# Patient Record
Sex: Male | Born: 1990 | Race: White | Hispanic: No | Marital: Single | Smoking: Current every day smoker
Health system: Southern US, Community
[De-identification: ages and names within clinical notes are randomized; demographics above are authoritative.]

## PROBLEM LIST (undated history)

## (undated) DIAGNOSIS — N289 Disorder of kidney and ureter, unspecified: Secondary | ICD-10-CM

---
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
## 2010-04-21 ENCOUNTER — Encounter: Payer: Self-pay | Admitting: Family Medicine

## 2010-04-22 ENCOUNTER — Encounter: Payer: Self-pay | Admitting: Family Medicine

## 2010-06-18 LAB — URINALYSIS, ROUTINE W REFLEX MICROSCOPIC
Bilirubin Urine: NEGATIVE
Hgb urine dipstick: NEGATIVE
Ketones, ur: NEGATIVE mg/dL
Protein, ur: NEGATIVE mg/dL
pH: 7.5 (ref 5.0–8.0)

## 2010-07-16 LAB — COMPREHENSIVE METABOLIC PANEL
ALT: 31 U/L (ref 0–53)
AST: 28 U/L (ref 0–37)
Alkaline Phosphatase: 77 U/L (ref 52–171)
Calcium: 9.2 mg/dL (ref 8.4–10.5)
Chloride: 107 mEq/L (ref 96–112)
Glucose, Bld: 99 mg/dL (ref 70–99)
Total Protein: 6.7 g/dL (ref 6.0–8.3)

## 2010-07-16 LAB — URINALYSIS, ROUTINE W REFLEX MICROSCOPIC
Bilirubin Urine: NEGATIVE
Glucose, UA: NEGATIVE mg/dL
Protein, ur: NEGATIVE mg/dL
Specific Gravity, Urine: 1.025 (ref 1.005–1.030)

## 2010-07-16 LAB — DIFFERENTIAL
Eosinophils Absolute: 0 10*3/uL (ref 0.0–1.2)
Lymphocytes Relative: 18 % — ABNORMAL LOW (ref 24–48)
Neutro Abs: 10.1 10*3/uL — ABNORMAL HIGH (ref 1.7–8.0)
Neutrophils Relative %: 76 % — ABNORMAL HIGH (ref 43–71)

## 2010-07-16 LAB — CBC
HCT: 39.9 % (ref 36.0–49.0)
MCHC: 34.1 g/dL (ref 31.0–37.0)
MCV: 80.3 fL (ref 78.0–98.0)
Platelets: 272 10*3/uL (ref 150–400)
RBC: 4.96 MIL/uL (ref 3.80–5.70)
RDW: 13.8 % (ref 11.4–15.5)
WBC: 13.2 10*3/uL (ref 4.5–13.5)

## 2010-07-16 LAB — RAPID URINE DRUG SCREEN, HOSP PERFORMED
Amphetamines: NOT DETECTED
Benzodiazepines: POSITIVE — AB
Tetrahydrocannabinol: POSITIVE — AB

## 2010-07-16 LAB — ETHANOL: Alcohol, Ethyl (B): <5 mg/dL (ref 0–10)
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
## 2010-08-16 NOTE — Discharge Summary (Signed)
NAMESOHRAB, [name]                  ACCOUNT NO.:  [id number]   MEDICAL RECORD NO.:  [id number]          PATIENT TYPE:  INP   LOCATION:  0203                          FACILITY:  [facility]   PHYSICIAN:  Lalla Brothers, MDDATE OF BIRTH:  [birth year]   DATE OF ADMISSION:  10/07/2004  DATE OF DISCHARGE:  10/12/2004                                 DISCHARGE SUMMARY   IDENTIFICATION:  This 20 year old male, entering the eighth grade at [employer], was admitted emergently involuntarily on a  [facility] petition for commitment in transfer from [facility] Emergency Department for inpatient psychiatric stabilization and  treatment of suicide attempt as well as homicidal ideation. The patient had  a four-week history of agitated depression and more longstanding disruptive  behavior. Mother contacted police relative to the patient's overdose with  alcohol and Xanax informing grandparents and sister he took the pills to  kill himself and threatening to kill mother and an ex-friend talking about  him also. For full details, please see the typed admission assessment.   SYNOPSIS OF PRESENT ILLNESS:  The patient appeared to have longstanding ADHD  and oppositional defiant disorder though without any previous treatment.  Father is incarcerated and his side of the family prompts the patient's  older sister as well as the patient toward substance abuse and disruptive  behavior. The patient visited biological father in prison at the end of the  preceding school year for the first time in 2-3 years. A favorite maternal  aunt in [place] is dying with cancer likely within the next month and the  patient is highly distressed over her illness. Mother has brain damage and  indicates that she has half a brain having been hit by car at age 59.  Grandmother attempts to oversee mother's decisions though mother enables the  patient's maladaptive behaviors while grandmother  attempts to set limits.  The patient has partial blackout for his threats when intoxicated. He  started cannabis at age 85 as well as beer at that time. His urine drug  screen was positive for benzodiazepine, likely Xanax. The patient seems to  identify with mother's cognitive limitations and may have borderline  capability himself. The family is aware that he learns slowly in school.  Father was violent and there is some mental illness as well as violence and  substance abuse on the paternal side of the family with father particularly  using alcohol. The patient was banging his head prior to admission. He  reports sensitivity to CHEESE, causing itching of his throat. He has been  noncompliant with his asthma inhaler and his Nicotrol inhaler. He maintains  that a contusion to the right elbow in September of 2005 was a fracture but  x-ray was negative.   INITIAL MENTAL STATUS EXAM:  The patient was interpersonally motorically  passive while being very resistant in his decisions and thoughts. He  establishes an air of borderline intellectual functioning. He has moderate  to severe dysphoria with an avoidant distant interpersonal style. There is  no definite psychosis but differential must  include thought blocking and  disorganization. He has object loss issues as well as repressed and  suppressed anger over father's incarceration. He simply validates his  homicide threats and tends toward denial of suicide or self-destructive  attempt.   LABORATORY FINDINGS:  At [facility] Emergency Department, the  patient's basic metabolic panel was normal with random glucose 166 at 1932  hours after overdose including with alcohol. Sodium was normal at 138,  potassium 3.9, creatinine 0.9, calcium 9.7, albumin 4.5. Urinalysis was  normal with specific gravity 1.010. CBC revealed MCV slightly low at 77.4  with reference range 80-98 and MCH was slightly low at 25.9 with reference  range 27-34. Red  cell count was slightly elevated at 5.77 million with upper  limit of normal 5.6 and white count was slightly elevated at 11,300 with  upper limit of normal 10,500. Urine drug screen was negative except positive  for benzodiazepines, having overdosed with Xanax. Blood alcohol was  negative. At the [facility], hepatic function panel was normal  with AST 17, ALT 12 and GGT 15. Free T4 was normal at 1.02 and TSH at 1.663.  RPR was nonreactive. Urine probe for gonorrhea and chlamydia trachomatis  were both negative. Electrocardiogram performed for supine bradycardia  revealed a heart rate of 48 for sinus bradycardia. QRS was normal at 98, QTC  of 421 and PR at 164 milliseconds with EKG otherwise normal.   HOSPITAL COURSE AND TREATMENT:  General medical exam by Mallie Darting PA-C  noted one half-pack per day of cigarettes since age 67. The patient reported  sexual activity. Reported a right knee injury playing football in the past  and has a dog bite scar on the left cheek. He was Tanner stage IV. He had a  nearly holosystolic murmur along the left sternal border and is not certain  whether this was noted in his asthma workup. Admission height was 62 inches  with weight of 115 pounds. Blood pressure on admission was 111/71 with heart  rate of 55 (sitting) and 131/71 with heart rate of 75 (standing). He had a  similar pattern throughout his hospital stay such that on the day of  discharge, supine blood pressure was 109/65 with heart rate of 44 and  standing blood pressure 118/62 with heart rate of 79. The patient's heart  rate ranged from 41-48 (supine) and ranged from 68-87 (standing). Blood  pressures were normal as were other vital signs. The patient manifested no  asthma or chest pain during his hospital stay. He had no exertional  intolerance but tended to be sluggish and underreactive. The patient was threatening for the initial half of the hospital stay planning elopement or   making other demands for discharge. The patient would be resistant so that  he would want liquid medicine one time and pills the next seemingly in the  end as a way to undermine his treatment. He was started on Wellbutrin  titrated up to 300 mg XL daily and changed at one point to 150 mg SR b.i.d.  as the patient for awhile stated he could not swallow the XL tablet. He did  take Zyprexa Zydis up to 10 mg at a single dose p.r.n. for agitation,  insomnia, or affective decompensation. He gradually worked through his  regressive and underachieving posture and became more sincere about  treatment. He participated in group, milieu, behavioral, individual, family,  special education, occupational and therapeutic recreational, anger  management and substance abuse interventions. By the  time of discharge, he  could state that he would remain sober and can see the value of such  particularly regarding the paternal family history. He worked through much  of the grief for his aunt who is dying and concluded by the time of  discharge the strength to attend the funeral and to be supportive for other  family members as well. He was compliant with medications by the time of  discharge though even on the day of discharge acting out with a new staff  nurse that he could not take his medication. He did take his medication,  however and compliance could be established throughout the hospital stay  though mother would enable his noncompliance at times how just not knowing  except what the patient wanted. Family therapy was with grandmother and  grandfather. The patient required no seclusion or restraint during the  hospital stay.   FINAL DIAGNOSES:   AXIS I:  1.  Major depression, single episode, severe with agitation.  2.  Oppositional defiant disorder.  3.  Attention-deficit hyperactivity disorder not otherwise specified with      predominant inattention.  4.  Psychoactive substance abuse not otherwise  specified.  5.  Parent-child problem.  6.  Other specified family circumstances.  7.  Other interpersonal problem.  8.  Noncompliance with treatment.   AXIS II:  1.  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  2.  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   AXIS III:  1.  Systolic cardiac murmur and supine sinus bradycardia.  2.  History of asthma.  3.  Microcytosis.  4.  Random glucose of 166.  5.  Cigarette and cannabis smoking bronchitis symptoms.  6.  Xanax overdose.   AXIS IV:  Stressors:  Family--severe, acute and chronic; school--moderate,  chronic; phase of life--severe, acute and chronic.   AXIS V:  Global Assessment of Functioning on admission 30; highest in last  year 65; discharge Global Assessment of Functioning 53.   CONDITION ON DISCHARGE:  The patient was discharged somewhat prematurely at the family insistence as they needed to see the aunt in [place]. The  patient was established on Wellbutrin and his depressive symptoms were  improved with suicidal ideation resolved. Sobriety is emphasized. They were  educated on medication including FDA guidelines. He elected to resume the XL  tablet once in the morning by the time of discharge. He is discharged on the  following medication.   DISCHARGE MEDICATIONS:  1.  Wellbutrin 300 mg XL every morning; quantity number 30 with one refill      prescribed.  2.  Zyprexa 5 mg tablet, to use 1 pill twice daily p.r.n. depressive      agitation; samples #14 provided.  3.  Asthma inhaler as per Dr. Kerin Ransom.   FOLLOW UP:  He is to see Dr. Kerin Ransom to review the cardiac murmur and the slow  heart rate and has a copy of his EKG in his possession. The patient will see  Claretta Fraise October 17, 2004 at 0900 for intake through [facility] and psychiatric medication management appointment can be scheduled  from that. He follows a regular diet and has no  restrictions on physical  activity at this time, otherwise. Crisis and safety plans are outlined if  needed.       GEJ/MEDQ  D:  10/15/2004  T:  10/15/2004  Job:  [id number]   cc:   Claretta Fraise  [facility]  [address]  [place], [place] [address]   Dr. Illene Bolus  [facility]  [address]  [place], [place] [address]

## 2010-08-16 NOTE — Procedures (Signed)
NAMEKEISEAN, [name]                  ACCOUNT NO.:  [id number]   MEDICAL RECORD NO.:  [id number]           PATIENT TYPE:   LOCATION:                                 FACILITY:   PHYSICIAN:  Edward L. Juanetta Gosling, M.D.     DATE OF BIRTH:   DATE OF PROCEDURE:  06/18/2004  DATE OF DISCHARGE:                              PULMONARY FUNCTION TEST   Spirometry shows a very mild decrease in the FEF 25/75 which could indicate  airflow obstruction at the level of small airways.  The patient does bear a  diagnosis of asthma based on the request and this test is somewhat  suggestive of asthma.      ELH/MEDQ  D:  06/18/2004  T:  06/18/2004  Job:  [id number]   cc:   Jeoffrey Massed, MD  [address]  [place]  [place] [address]  Fax: [phone]

## 2012-11-10 DIAGNOSIS — R42 Dizziness and giddiness: Secondary | ICD-10-CM

## 2015-03-29 ENCOUNTER — Encounter (HOSPITAL_COMMUNITY): Payer: Self-pay | Admitting: *Deleted

## 2015-03-29 ENCOUNTER — Emergency Department (HOSPITAL_COMMUNITY)
Admission: EM | Admit: 2015-03-29 | Discharge: 2015-03-29 | Disposition: A | Discharge status: Home / Self Care | Payer: Self-pay | Attending: Emergency Medicine | Admitting: Emergency Medicine

## 2015-03-29 DIAGNOSIS — F17200 Nicotine dependence, unspecified, uncomplicated: Secondary | ICD-10-CM | POA: Insufficient documentation

## 2015-03-29 DIAGNOSIS — K047 Periapical abscess without sinus: Secondary | ICD-10-CM | POA: Insufficient documentation

## 2015-03-29 MED ORDER — HYDROCODONE-ACETAMINOPHEN 5-325 MG PO TABS: 2.0000 | ORAL_TABLET | ORAL | Status: DC | PRN

## 2015-03-29 MED ORDER — AMOXICILLIN-POT CLAVULANATE 875-125 MG PO TABS: 1.0000 | ORAL_TABLET | Freq: Two times a day (BID) | ORAL | Status: DC

## 2015-03-29 NOTE — Discharge Instructions (Signed)
Dental Abscess °A dental abscess is a collection of pus in or around a tooth. °CAUSES °This condition is caused by a bacterial infection around the root of the tooth that involves the inner part of the tooth (pulp). It may result from: °· Severe tooth decay. °· Trauma to the tooth that allows bacteria to enter into the pulp, such as a broken or chipped tooth. °· Severe gum disease around a tooth. °SYMPTOMS °Symptoms of this condition include: °· Severe pain in and around the infected tooth. °· Swelling and redness around the infected tooth, in the mouth, or in the face. °· Tenderness. °· Pus drainage. °· Bad breath. °· Bitter taste in the mouth. °· Difficulty swallowing. °· Difficulty opening the mouth. °· Nausea. °· Vomiting. °· Chills. °· Swollen neck glands. °· Fever. °DIAGNOSIS °This condition is diagnosed with examination of the infected tooth. During the exam, your dentist may tap on the infected tooth. Your dentist will also ask about your medical and dental history and may order X-rays. °TREATMENT °This condition is treated by eliminating the infection. This may be done with: °· Antibiotic medicine. °· A root canal. This may be performed to save the tooth. °· Pulling (extracting) the tooth. This may also involve draining the abscess. This is done if the tooth cannot be saved. °HOME CARE INSTRUCTIONS °· Take medicines only as directed by your dentist. °· If you were prescribed antibiotic medicine, finish all of it even if you start to feel better. °· Rinse your mouth (gargle) often with salt water to relieve pain or swelling. °· Do not drive or operate heavy machinery while taking pain medicine. °· Do not apply heat to the outside of your mouth. °· Keep all follow-up visits as directed by your dentist. This is important. °SEEK MEDICAL CARE IF: °· Your pain is worse and is not helped by medicine. °SEEK IMMEDIATE MEDICAL CARE IF: °· You have a fever or chills. °· Your symptoms suddenly get worse. °· You have a  very bad headache. °· You have problems breathing or swallowing. °· You have trouble opening your mouth. °· You have swelling in your neck or around your eye. °  °This information is not intended to replace advice given to you by your health care provider. Make sure you discuss any questions you have with your health care provider. °  °Document Released: 03/17/2005 Document Revised: 08/01/2014 Document Reviewed: 03/14/2014 °Elsevier Interactive Patient Education ©2016 Elsevier Inc. ° °Emergency Department Resource Guide °1) Find a Doctor and Pay Out of Pocket °Although you won't have to find out who is covered by your insurance plan, it is a good idea to ask around and get recommendations. You will then need to call the office and see if the doctor you have chosen will accept you as a new patient and what types of options they offer for patients who are self-pay. Some doctors offer discounts or will set up payment plans for their patients who do not have insurance, but you will need to ask so you aren't surprised when you get to your appointment. ° °2) Contact Your Local Health Department °Not all health departments have doctors that can see patients for sick visits, but many do, so it is worth a call to see if yours does. If you don't know where your local health department is, you can check in your phone book. The CDC also has a tool to help you locate your state's health department, and many state websites also have listings   of all of their local health departments. ° °3) Find a Walk-in Clinic °If your illness is not likely to be very severe or complicated, you may want to try a walk in clinic. These are popping up all over the country in pharmacies, drugstores, and shopping centers. They're usually staffed by nurse practitioners or physician assistants that have been trained to treat common illnesses and complaints. They're usually fairly quick and inexpensive. However, if you have serious medical issues or  chronic medical problems, these are probably not your best option. ° °No Primary Care Doctor: °- Call [facility] at  832-8000 - they can help you locate a primary care doctor that  accepts your insurance, provides certain services, etc. °- Physician Referral Service- 1-800-533-3463 ° °Chronic Pain Problems: °Organization         Address  Phone   Notes  °[facility]  (336) 297-2271 Patients need to be referred by their primary care doctor.  ° °Medication Assistance: °Organization         Address  Phone   Notes  °[place] [facility] [address] °[place], [place] [address] (336) 641-8030 --Must be a resident of [place] °-- Must have NO insurance coverage whatsoever (no Medicaid/ Medicare, etc.) °-- The pt. MUST have a primary care doctor that directs their care regularly and follows them in the community °  °[facility]  (866) 331-1348   °[employer]  (888) 892-1162   ° °Agencies that provide inexpensive medical care: °Organization         Address  Phone   Notes  °[facility]  (336) 832-8035   ° Internal Medicine    (336) 832-7272   °[facility] [address] °[place], [place] [address] (336) 832-4777   °[facility] [address], °[place] (336) 271-4999   °[facility]    (336) 373-0678   °[facility]    (336) 272-1050   °[facility] ° [address], [place] Phone:  (336) 832-4444, Fax:  (336) 832-4440 Hours of Operation:  9 am - 6 pm, M-F.  Also accepts Medicaid/Medicare and self-pay.  °[facility] ° [address], [place] Phone: (336) 832-3150, Fax: (336) 832-3151. Hours of Operation:  8:30 am - 5:30 pm, M-F.  Also accepts Medicaid and self-pay.  °[facility] [place] [address], [place] Phone: (336) 878-6027   °[facility] [address], [place], [place]  (336)723-1848, Ext. 123 Mondays & Thursdays: 7-9 AM.  First 15 patients are seen on a first come, first serve basis. °  ° °Medicaid-accepting [place] Providers: ° °Organization         Address  Phone   Notes  °[facility] [address], [place] (336) 641-2100 Also accepts self-pay patients.  °[facility] [address], [place] ° (336) 856-9996   °[facility] [address], [place] (336) 288-8857   °[facility] [address] [place] [address], [place] (336) 299-7000   °Veita Bland [address], [place]  ° (336) 373-1557 Only accepts [place] Access Medicaid patients after they have their name applied to their card.  ° °Self-Pay (no insurance) in [place]: ° °Organization         Address  Phone   Notes  °  Sickle Cell Patients, [facility] [address], [place] (336) 832-1970   °[facility] [address], [place] (336) 832-4400   °[facility] ° [address], [place] (336) 992-4800   °[facility]/Dr. Osei-Bonsu ° [address] [place] [address], [place] or [address], [place] (336) 841-8500 Phone number for both [place] and [place] locations is the same.  °[facility] [address], [place] (336) 299-0000   °[facility] [address] [place] [address], [place] or [address] (336) 852-7530 °(336) 878-2260   °[facility] [address], [place] (336) 350-1642, phone; (336) 294-5005, fax Sees patients 1st and 3rd Saturday of every month.  Must not qualify for public or private insurance (i.e. Medicaid, Medicare, [place] Health Choice, Veterans' Benefits) • Household income should be no more than 200% of the poverty level •The clinic cannot treat you if you are pregnant or think you are pregnant • Sexually transmitted  diseases are not treated at the clinic.  ° ° °Dental Care: °Organization         Address  Phone  Notes  °[facility] [facility] [address], [place] (336) 641-6152 Accepts children up to age 21 who are enrolled in Medicaid or [place] Health Choice; pregnant women with a Medicaid card; and children who have applied for Medicaid or [place] Health Choice, but were declined, whose parents can pay a reduced fee at time of service.  °[facility] [place]  [address], [place] (336) 641-7733 Accepts children up to age 21 who are enrolled in Medicaid or [place] Health Choice; pregnant women with a Medicaid card; and children who have applied for Medicaid or [place] Health Choice, but were declined, whose parents can pay a reduced fee at time of service.  °[facility] ° [address], [place] (336) 641-4533 Patients are seen by appointment only. Walk-ins are not accepted. [facility] will see patients 18 years of age and older. °Monday - Tuesday (8am-5pm) °Most Wednesdays (8:30-5pm) °$30 per visit, cash only  °[facility] ° [address], [place] (336) 641-4533 Patients are seen by appointment only. Walk-ins are not accepted. [facility] will see patients 18 years of age and older. °One Wednesday Evening (Monthly: Volunteer Based).  $30 per visit, cash only  °[facility]  (919) 537-3737 for adults; Children under age 4, call Graduate Pediatric Dentistry at (919) [phone]. Children aged 4-14, please call (919) [phone] to request a pediatric application. ° Dental services are provided in all areas of dental care including fillings, crowns and bridges, complete and partial dentures, implants, gum treatment, root canals, and extractions. Preventive care is also provided. Treatment is provided to both adults and children. °Patients are selected via a  lottery and there is often a waiting list. °  °[facility] [address], ° ° (336) 763-8833 [email or web address] °  °[facility] [address], [place], [place] (336)723-1848, Ext. 123 Second and Fourth Thursday of each month, opens at 6:30 AM; Clinic ends at 9 AM.  Patients are seen on a first-come first-served basis, and a limited number are seen during each clinic.  ° °[facility] ° [address], [place], [place] (336) 723-7904   Eligibility   Requirements °You must have lived in [place], [place], or [place] counties for at least the last three months. °  You cannot be eligible for state or federal sponsored healthcare insurance, including Veterans Administration, Medicaid, or Medicare. °  You generally cannot be eligible for healthcare insurance through your employer.  °  How to apply: °Eligibility screenings are held every Tuesday and Wednesday afternoon from 1:00 pm until 4:00 pm. You do not need an appointment for the interview!  °[facility] [address], [place], [place] 336-631-2330   °[facility]  336-342-8273   °[facility]  336-703-3100   °[facility]  336-570-6415   ° °Behavioral Health Resources in the Community: °Intensive Outpatient Programs °Organization         Address  Phone  Notes  °[facility] [address], [place], [place] 336-878-6098   °[facility] [address], [place], [place] 336-832-9800   °[facility] [address], [place], [place] ° 336-882-2125   °[facility] [address],  °[place], [place] 1-800-853-5163 or 336-641-4981   °Substance Abuse Resources °Organization         Address  Phone  Notes  °[facility]  336-882-2125   °[facility]  336-784-9470   °[facility]  336-285-9073   °[facility]  336-845-3988   °Residential &  Outpatient Substance Abuse Program  1-800-659-3381   °Psychological Services °Organization         Address  Phone  Notes  °[facility]  336- 832-9600   °[facility]  336- 378-7881   °[facility] [address], [place] 1-800-853-5163 or 336-641-4981   ° °Mobile Crisis Teams °Organization         Address  Phone  Notes  °[facility], Mobile Crisis Care Unit  1-877-626-1772   °Assertive °[facility] ° [address] [place], [place] 336-834-9664   °Sharon DeEsch [address] °[place] 336-554-5454   ° °Self-Help/Support Groups °Organization         Address  Phone             Notes  °[facility] [place] - variety of support groups  336- 373-1402 Call for more information  °Narcotics Anonymous (NA), [facility] [address], °[place]  2 meetings at this location  ° °Residential Treatment Programs °Organization         Address  Phone  Notes  °[facility] [address],    °[place]  1-866-801-8205   °[facility] ° [address], [place], [place] 704-293-8524   °[facility] [address], [place] 336-845-3988 Admissions: 8am-3pm M-F  °[facility] [address]    °[place], [place] 336-841-1104   °[facility] [address], [place], [place] 336-379-7146   °[facility] [address]  °[place], [place] 336-285-9073   °[facility] - Intensive Outpatient [address], [place], [place] 336-852-3033   °[facility] [address]  °[place], [place] 1-877-615-2722 or 336-784-9470   °[facility] [address] [place], [place] 336-227-7417 Accepts Medicaid  °[facility] [address]  °[place] 1-800-659-3381 Substance Abuse/Addiction Treatment  ° °[place] Behavioral Health Resources °Organization            Address  Phone  Notes  °[facility]  (888) 581-9988   °Julie Brannon, PhD [address] [place], [place]   (336) 349-5553 or (336) 951-0000   °[facility]   [address] °[place], [place] (336) 349-4454   °[facility] [address], [place], [place] (336) 342-8316 Insurance/Medicaid/sponsorship through [facility]  °[facility] [address]                                    [place], [place] (336) 342-8316 Therapy/tele-psych/case  °[facility] [address]  ° [place], [place] (336) 349-2233    °Dr. Arfeen  (336) 349-4544   °[facility] [place]  [employer] [place] [facility] 1) [address], [place] °2) [address], [place] °3)  [address], [place] (336) 349-3220 °(336) 342-7768 ° °(336) 342-8140   °[place] [facility] (336) 342-1394 or (336) 342-3537 (After Hours)    ° ° °

## 2015-03-29 NOTE — ED Provider Notes (Signed)
CSN: [id number]     Arrival date & time 03/29/15  0543 History   First MD Initiated Contact with Patient 03/29/15 [phone]     Chief Complaint  Patient presents with  . Dental Pain     (Consider location/radiation/quality/duration/timing/severity/associated sxs/prior Treatment) HPI Comments: Patient presents to the ER for evaluation of dental pain. Patient reports that he has been having problems on and off for a month, but in the last 24 hours has noticed severe pain and swelling in the right side of his lower jaw.  Patient is a 24 y.o. male presenting with tooth pain.  Dental Pain   History reviewed. No pertinent past medical history. History reviewed. No pertinent past surgical history. No family history on file. Social History  Substance Use Topics  . Smoking status: Current Every Day Smoker  . Smokeless tobacco: None  . Alcohol Use: No    Review of Systems  HENT: Positive for dental problem.   All other systems reviewed and are negative.     Allergies  Review of patient's allergies indicates no known allergies.  Home Medications   Prior to Admission medications   Not on File   BP 129/91 mmHg  Pulse 78  Temp(Src) 97.8 F (36.6 C) (Oral)  Resp 16  SpO2 97% Physical Exam  Constitutional: He is oriented to person, place, and time. He appears well-developed and well-nourished. No distress.  HENT:  Head: Normocephalic and atraumatic.  Right Ear: Hearing normal.  Left Ear: Hearing normal.  Nose: Nose normal.  Mouth/Throat: Oropharynx is clear and moist and mucous membranes are normal.    Eyes: Conjunctivae and EOM are normal. Pupils are equal, round, and reactive to light.  Neck: Normal range of motion. Neck supple.  Cardiovascular: Regular rhythm, S1 normal and S2 normal.  Exam reveals no gallop and no friction rub.   No murmur heard. Pulmonary/Chest: Effort normal and breath sounds normal. No respiratory distress. He exhibits no tenderness.  Abdominal: Soft.  Normal appearance and bowel sounds are normal. There is no hepatosplenomegaly. There is no tenderness. There is no rebound, no guarding, no tenderness at McBurney's point and negative Murphy's sign. No hernia.  Musculoskeletal: Normal range of motion.  Neurological: He is alert and oriented to person, place, and time. He has normal strength. No cranial nerve deficit or sensory deficit. Coordination normal. GCS eye subscore is 4. GCS verbal subscore is 5. GCS motor subscore is 6.  Skin: Skin is warm, dry and intact. No rash noted. No cyanosis.  Psychiatric: He has a normal mood and affect. His speech is normal and behavior is normal. Thought content normal.  Nursing note and vitals reviewed.   ED Course  Procedures (including critical care time) Labs Review Labs Reviewed - No data to display  Imaging Review No results found. I have personally reviewed and evaluated these images and lab results as part of my medical decision-making.   EKG Interpretation None      MDM   Final diagnoses:  None  Dental Abscess  Presents with spontaneously draining dental abscess of the right lower jaw. Will initiate antibiotic coverage, analgesia. Patient to follow-up with dentist.    Gilda Creasehristopher J Malijah Lietz, MD 03/29/15 [phone]

## 2015-03-29 NOTE — ED Notes (Signed)
Pt c/o dental pain that started last night,

## 2015-03-29 NOTE — ED Notes (Signed)
Pt states understanding of care given and follow up instructions 

## 2015-05-15 ENCOUNTER — Emergency Department (HOSPITAL_COMMUNITY)
Admission: EM | Admit: 2015-05-15 | Discharge: 2015-05-15 | Disposition: A | Discharge status: Home / Self Care | Payer: Self-pay | Attending: Emergency Medicine | Admitting: Emergency Medicine

## 2015-05-15 ENCOUNTER — Encounter (HOSPITAL_COMMUNITY): Payer: Self-pay | Admitting: *Deleted

## 2015-05-15 ENCOUNTER — Emergency Department (HOSPITAL_COMMUNITY): Payer: Self-pay

## 2015-05-15 DIAGNOSIS — Y9389 Activity, other specified: Secondary | ICD-10-CM | POA: Insufficient documentation

## 2015-05-15 DIAGNOSIS — W208XXA Other cause of strike by thrown, projected or falling object, initial encounter: Secondary | ICD-10-CM | POA: Insufficient documentation

## 2015-05-15 DIAGNOSIS — F17200 Nicotine dependence, unspecified, uncomplicated: Secondary | ICD-10-CM | POA: Insufficient documentation

## 2015-05-15 DIAGNOSIS — Z792 Long term (current) use of antibiotics: Secondary | ICD-10-CM | POA: Insufficient documentation

## 2015-05-15 DIAGNOSIS — Y9289 Other specified places as the place of occurrence of the external cause: Secondary | ICD-10-CM | POA: Insufficient documentation

## 2015-05-15 DIAGNOSIS — S20212A Contusion of left front wall of thorax, initial encounter: Secondary | ICD-10-CM | POA: Insufficient documentation

## 2015-05-15 DIAGNOSIS — S299XXA Unspecified injury of thorax, initial encounter: Secondary | ICD-10-CM

## 2015-05-15 DIAGNOSIS — Y998 Other external cause status: Secondary | ICD-10-CM | POA: Insufficient documentation

## 2015-05-15 DIAGNOSIS — R062 Wheezing: Secondary | ICD-10-CM | POA: Insufficient documentation

## 2015-05-15 MED ORDER — ALBUTEROL (5 MG/ML) CONTINUOUS INHALATION SOLN
10.0000 mg/h | INHALATION_SOLUTION | Freq: Once | RESPIRATORY_TRACT | Status: AC
  Administered 2015-05-15: 10 mg/h via RESPIRATORY_TRACT

## 2015-05-15 MED ORDER — IBUPROFEN 800 MG PO TABS: 800.0000 mg | ORAL_TABLET | Freq: Three times a day (TID) | ORAL | Status: DC | PRN

## 2015-05-15 MED ORDER — ALBUTEROL SULFATE HFA 108 (90 BASE) MCG/ACT IN AERS
2.0000 | INHALATION_SPRAY | Freq: Once | RESPIRATORY_TRACT | Status: AC
  Administered 2015-05-15: 2 via RESPIRATORY_TRACT
  Filled 2015-05-15: qty 6.7

## 2015-05-15 NOTE — ED Notes (Signed)
Pt c/o chest pain that started x 4 days ago; pt states he was wrestling with someone and the person landed on his left side of chest and he has been having pain since then

## 2015-05-15 NOTE — ED Provider Notes (Signed)
TIME SEEN: 2:00 AM  CHIEF COMPLAINT: chest wall pain  HPI: Pt is a 25 y.o. 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 States it hurts to move and breathe. No head injury or loss of consciousness. No numbness, tingling or focal weakness. No other injury.   Patient is wheezing on exam. He denies shortness of breath. No cough. No fever. No history of asthma. He is a smoker.  ROS: See HPI Constitutional: no fever  Eyes: no drainage  ENT: no runny nose   Cardiovascular:   chest pain  Resp: no SOB  GI: no vomiting GU: no dysuria Integumentary: no rash  Allergy: no hives  Musculoskeletal: no leg swelling  Neurological: no slurred speech ROS otherwise negative  PAST MEDICAL HISTORY/PAST SURGICAL HISTORY:  History reviewed. No pertinent past medical history.  MEDICATIONS:  Prior to Admission medications   Medication Sig Start Date End Date Taking? Authorizing Provider  amoxicillin-clavulanate (AUGMENTIN) 875-125 MG tablet Take 1 tablet by mouth 2 (two) times daily. 03/29/15   Gilda Crease, MD  HYDROcodone-acetaminophen (NORCO/VICODIN) 5-325 MG tablet Take 2 tablets by mouth every 4 (four) hours as needed for moderate pain. 03/29/15   Gilda Crease, MD    ALLERGIES:  No Known Allergies  SOCIAL HISTORY:  Social History  Substance Use Topics  . Smoking status: Current Every Day Smoker -- 0.50 packs/day  . Smokeless tobacco: Not on file  . Alcohol Use: Yes     Comment: occasionally    FAMILY HISTORY: History reviewed. No pertinent family history.  EXAM: BP 129/70 mmHg  Pulse 90  Temp(Src) 97.4 F (36.3 C) (Oral)  Resp 20  Ht  (1.676 m)  Wt 149 lb (67.586 kg)  BMI 24.06 kg/m2  SpO2 100% CONSTITUTIONAL: Alert and oriented and responds appropriately to questions. Well-appearing; well-nourished; GCS 15 HEAD: Normocephalic;  atraumatic EYES: Conjunctivae clear, PERRL, EOMI ENT: normal nose; no rhinorrhea; moist mucous membranes; pharynx without lesions noted; no dental injury; no septal hematoma NECK: Supple, no meningismus, no LAD; no midline spinal tenderness, step-off or deformity CARD: RRR; S1 and S2 appreciated; no murmurs, no clicks, no rubs, no gallops RESP: Normal chest excursion without splinting or tachypnea; breath sounds equal bilaterally, diffuse expiratory wheezing worse at his bases, no rhonchi, no rales; no hypoxia or respiratory distress CHEST:  chest wall stable, no crepitus or ecchymosis or deformity, tender over the left ribs, no flail chest ABD/GI: Normal bowel sounds; non-distended; soft, non-tender, no rebound, no guarding PELVIS:  stable, nontender to palpation BACK:  The back appears normal and is non-tender to palpation, there is no CVA tenderness; no midline spinal tenderness, step-off or deformity EXT: Normal ROM in all joints; non-tender to palpation; no edema; normal capillary refill; no cyanosis, no bony tenderness or bony deformity of patient's extremities, no joint effusion, no ecchymosis or lacerations    SKIN: Normal color for age and race; warm NEURO: Moves all extremities equally, sensation to light touch intact diffusely, cranial nerves II through XII intact PSYCH: The patient's mood and manner are appropriate. Grooming and personal hygiene are appropriate.  MEDICAL DECISION MAKING: Patient here with chest contusion that occurred 4 days ago. X-ray shows no rib fractures, pneumothorax, atelectasis. He is not hypoxic. He does have some wheezing suspect this is from smoking. Advised to quit smoking. Have given him albuterol treatment his lungs are clear to  auscultation. No respiratory distress or increased work of breathing. I feel he is safe to be discharged home. Advised to use ibuprofen and Tylenol for pain control. Will also discharge with albuterol inhaler for his wheezing. I do not  think he needs to be on steroids at this time. He denies history of asthma, COPD, recent fevers, cough or shortness of breath. Discussed return precautions. He verbalizes understanding and is comfortable with this plan. Given outpatient follow-up.     EKG Interpretation  Date/Time:  Tuesday May 15 2015 01:47:13 EST Ventricular Rate:  58 PR Interval:  180 QRS Duration: 80 QT Interval:  381 QTC Calculation: 374 R Axis:   65 Text Interpretation:  Sinus arrhythmia No significant change since last tracing Confirmed by WARD,  DO, KRISTEN [phone]) on 05/15/2015 1:53:20 AM         Layla Maw Ward, DO 05/15/15 4782

## 2015-05-15 NOTE — Discharge Instructions (Signed)
Rib Contusion A rib contusion is a deep bruise on your rib area. Contusions are the result of a blunt trauma that causes bleeding and injury to the tissues under the skin. A rib contusion may involve bruising of the ribs and of the skin and muscles in the area. The skin overlying the contusion may turn blue, purple, or yellow. Minor injuries will give you a painless contusion, but more severe contusions may stay painful and swollen for a few weeks. CAUSES  A contusion is usually caused by a blow, trauma, or direct force to an area of the body. This often occurs while playing contact sports. SYMPTOMS  Swelling and redness of the injured area.  Discoloration of the injured area.  Tenderness and soreness of the injured area.  Pain with or without movement. DIAGNOSIS  The diagnosis can be made by taking a medical history and performing a physical exam. An X-ray, CT scan, or MRI may be needed to determine if there were any associated injuries, such as broken bones (fractures) or internal injuries. TREATMENT  Often, the best treatment for a rib contusion is rest. Icing or applying cold compresses to the injured area may help reduce swelling and inflammation. Deep breathing exercises may be recommended to reduce the risk of partial lung collapse and pneumonia. Over-the-counter or prescription medicines may also be recommended for pain control. HOME CARE INSTRUCTIONS   Apply ice to the injured area:  Put ice in a plastic bag.  Place a towel between your skin and the bag.  Leave the ice on for 20 minutes, 2-3 times per day.  Take medicines only as directed by your health care provider.  Rest the injured area. Avoid strenuous activity and any activities or movements that cause pain. Be careful during activities and avoid bumping the injured area.  Perform deep-breathing exercises as directed by your health care provider.  Do not lift anything that is heavier than 5 lb (2.3 kg) until your  health care provider approves.  Do not use any tobacco products, including cigarettes, chewing tobacco, or electronic cigarettes. If you need help quitting, ask your health care provider. SEEK MEDICAL CARE IF:   You have increased bruising or swelling.  You have pain that is not controlled with treatment.  You have a fever. SEEK IMMEDIATE MEDICAL CARE IF:   You have difficulty breathing or shortness of breath.  You develop a continual cough, or you cough up thick or bloody sputum.  You feel sick to your stomach (nauseous), you throw up (vomit), or you have abdominal pain.   This information is not intended to replace advice given to you by your health care provider. Make sure you discuss any questions you have with your health care provider.   Document Released: 12/10/2000 Document Revised: 04/07/2014 Document Reviewed: 12/27/2013 Elsevier Interactive Patient Education 2016 [employer].  Smoking Cessation, Tips for Success If you are ready to quit smoking, congratulations! You have chosen to help yourself be healthier. Cigarettes bring nicotine, tar, carbon monoxide, and other irritants into your body. Your lungs, heart, and blood vessels will be able to work better without these poisons. There are many different ways to quit smoking. Nicotine gum, nicotine patches, a nicotine inhaler, or nicotine nasal spray can help with physical craving. Hypnosis, support groups, and medicines help break the habit of smoking. WHAT THINGS CAN I DO TO MAKE QUITTING EASIER?  Here are some tips to help you quit for good: Pick a date when you will quit smoking  completely. Tell all of your friends and family about your plan to quit on that date. Do not try to slowly cut down on the number of cigarettes you are smoking. Pick a quit date and quit smoking completely starting on that day. Throw away all cigarettes.  Clean and remove all ashtrays from your home, work, and car. On a card, write down your  reasons for quitting. Carry the card with you and read it when you get the urge to smoke. Cleanse your body of nicotine. Drink enough water and fluids to keep your urine clear or pale yellow. Do this after quitting to flush the nicotine from your body. Learn to predict your moods. Do not let a bad situation be your excuse to have a cigarette. Some situations in your life might tempt you into wanting a cigarette. Never have "just one" cigarette. It leads to wanting another and another. Remind yourself of your decision to quit. Change habits associated with smoking. If you smoked while driving or when feeling stressed, try other activities to replace smoking. Stand up when drinking your coffee. Brush your teeth after eating. Sit in a different chair when you read the paper. Avoid alcohol while trying to quit, and try to drink fewer caffeinated beverages. Alcohol and caffeine may urge you to smoke. Avoid foods and drinks that can trigger a desire to smoke, such as sugary or spicy foods and alcohol. Ask people who smoke not to smoke around you. Have something planned to do right after eating or having a cup of coffee. For example, plan to take a walk or exercise. Try a relaxation exercise to calm you down and decrease your stress. Remember, you may be tense and nervous for the first 2 weeks after you quit, but this will pass. Find new activities to keep your hands busy. Play with a pen, coin, or rubber band. Doodle or draw things on paper. Brush your teeth right after eating. This will help cut down on the craving for the taste of tobacco after meals. You can also try mouthwash.  Use oral substitutes in place of cigarettes. Try using lemon drops, carrots, cinnamon sticks, or chewing gum. Keep them handy so they are available when you have the urge to smoke. When you have the urge to smoke, try deep breathing. Designate your home as a nonsmoking area. If you are a heavy smoker, ask your health care  provider about a prescription for nicotine chewing gum. It can ease your withdrawal from nicotine. Reward yourself. Set aside the cigarette money you save and buy yourself something nice. Look for support from others. Join a support group or smoking cessation program. Ask someone at home or at work to help you with your plan to quit smoking. Always ask yourself, "Do I need this cigarette or is this just a reflex?" Tell yourself, "Today, I choose not to smoke," or "I do not want to smoke." You are reminding yourself of your decision to quit. Do not replace cigarette smoking with electronic cigarettes (commonly called e-cigarettes). The safety of e-cigarettes is unknown, and some may contain harmful chemicals. If you relapse, do not give up! Plan ahead and think about what you will do the next time you get the urge to smoke. HOW WILL I FEEL WHEN I QUIT SMOKING? You may have symptoms of withdrawal because your body is used to nicotine (the addictive substance in cigarettes). You may crave cigarettes, be irritable, feel very hungry, cough often, get headaches, or have difficulty  concentrating. The withdrawal symptoms are only temporary. They are strongest when you first quit but will go away within 10-14 days. When withdrawal symptoms occur, stay in control. Think about your reasons for quitting. Remind yourself that these are signs that your body is healing and getting used to being without cigarettes. Remember that withdrawal symptoms are easier to treat than the major diseases that smoking can cause.  Even after the withdrawal is over, expect periodic urges to smoke. However, these cravings are generally short lived and will go away whether you smoke or not. Do not smoke! WHAT RESOURCES ARE AVAILABLE TO HELP ME QUIT SMOKING? Your health care provider can direct you to community resources or hospitals for support, which may include: Group support. Education. Hypnosis. Therapy.   This information is not  intended to replace advice given to you by your health care provider. Make sure you discuss any questions you have with your health care provider.   Document Released: 12/14/2003 Document Revised: 04/07/2014 Document Reviewed: 09/02/2012 Elsevier Interactive Patient Education [employer]

## 2015-07-09 ENCOUNTER — Encounter (HOSPITAL_COMMUNITY): Payer: Self-pay | Admitting: *Deleted

## 2015-07-09 ENCOUNTER — Emergency Department (HOSPITAL_COMMUNITY)
Admission: EM | Admit: 2015-07-09 | Discharge: 2015-07-09 | Disposition: A | Discharge status: Home / Self Care | Payer: Self-pay | Attending: Emergency Medicine | Admitting: Emergency Medicine

## 2015-07-09 ENCOUNTER — Emergency Department (HOSPITAL_COMMUNITY): Payer: Self-pay

## 2015-07-09 DIAGNOSIS — F17200 Nicotine dependence, unspecified, uncomplicated: Secondary | ICD-10-CM | POA: Insufficient documentation

## 2015-07-09 DIAGNOSIS — R109 Unspecified abdominal pain: Secondary | ICD-10-CM

## 2015-07-09 DIAGNOSIS — M545 Low back pain: Secondary | ICD-10-CM | POA: Insufficient documentation

## 2015-07-09 DIAGNOSIS — R21 Rash and other nonspecific skin eruption: Secondary | ICD-10-CM | POA: Insufficient documentation

## 2015-07-09 HISTORY — DX: Disorder of kidney and ureter, unspecified: N28.9

## 2015-07-09 LAB — BASIC METABOLIC PANEL
Anion gap: 9 (ref 5–15)
BUN: 12 mg/dL (ref 6–20)
CALCIUM: 8.7 mg/dL — AB (ref 8.9–10.3)
CO2: 22 mmol/L (ref 22–32)
CREATININE: 0.98 mg/dL (ref 0.61–1.24)
Chloride: 106 mmol/L (ref 101–111)
Glucose, Bld: 115 mg/dL — ABNORMAL HIGH (ref 65–99)
Potassium: 3.6 mmol/L (ref 3.5–5.1)
SODIUM: 137 mmol/L (ref 135–145)

## 2015-07-09 LAB — URINALYSIS, ROUTINE W REFLEX MICROSCOPIC
Bilirubin Urine: NEGATIVE
Glucose, UA: NEGATIVE mg/dL
KETONES UR: NEGATIVE mg/dL
LEUKOCYTES UA: NEGATIVE
NITRITE: NEGATIVE
PH: 7 (ref 5.0–8.0)
Protein, ur: NEGATIVE mg/dL
SPECIFIC GRAVITY, URINE: 1.01 (ref 1.005–1.030)

## 2015-07-09 LAB — CBC
HCT: 43.4 % (ref 39.0–52.0)
Hemoglobin: 14.6 g/dL (ref 13.0–17.0)
MCH: 27.4 pg (ref 26.0–34.0)
MCHC: 33.6 g/dL (ref 30.0–36.0)
MCV: 81.6 fL (ref 78.0–100.0)
PLATELETS: 349 10*3/uL (ref 150–400)
RBC: 5.32 MIL/uL (ref 4.22–5.81)
RDW: 13.4 % (ref 11.5–15.5)
WBC: 9.9 10*3/uL (ref 4.0–10.5)

## 2015-07-09 LAB — URINE MICROSCOPIC-ADD ON
Bacteria, UA: NONE SEEN
WBC UA: NONE SEEN WBC/hpf (ref 0–5)

## 2015-07-09 MED ORDER — ONDANSETRON HCL 4 MG/2ML IJ SOLN
4.0000 mg | Freq: Once | INTRAMUSCULAR | Status: AC
  Administered 2015-07-09: 4 mg via INTRAVENOUS
  Filled 2015-07-09: qty 2

## 2015-07-09 MED ORDER — SODIUM CHLORIDE 0.9 % IV SOLN: INTRAVENOUS | Status: DC

## 2015-07-09 MED ORDER — FENTANYL CITRATE (PF) 100 MCG/2ML IJ SOLN
50.0000 ug | Freq: Once | INTRAMUSCULAR | Status: AC
  Administered 2015-07-09: 50 ug via INTRAVENOUS
  Filled 2015-07-09: qty 2

## 2015-07-09 MED ORDER — NAPROXEN 500 MG PO TABS: 500.0000 mg | ORAL_TABLET | Freq: Two times a day (BID) | ORAL | Status: DC

## 2015-07-09 MED ORDER — SODIUM CHLORIDE 0.9 % IV BOLUS (SEPSIS)
1000.0000 mL | Freq: Once | INTRAVENOUS | Status: AC
  Administered 2015-07-09: 1000 mL via INTRAVENOUS

## 2015-07-09 NOTE — Discharge Instructions (Signed)
Workup for the flank pain without evidence of any ureteral stones. No acute intra-abdominal process. Labs without any significant abnormalities. Suspect a muscular skeletal is the cause of the pain. Take the Naprosyn as directed.

## 2015-07-09 NOTE — ED Provider Notes (Signed)
CSN: [id number]     Arrival date & time 07/09/15  0804 History  By signing my name below, I, Emmanuella Mensah, attest that this documentation has been prepared under the direction and in the presence of Vanetta Mulders, MD. Electronically Signed: Angelene Giovanni, ED Scribe. 07/09/2015. 8:45 AM.    Chief Complaint  Patient presents with  . Flank Pain   Patient is a 25 y.o. male presenting with flank pain. The history is provided by the patient. No language interpreter was used.  Flank Pain This is a new problem. The current episode started more than 1 week ago. The problem occurs constantly. The problem has been gradually worsening. Associated symptoms include abdominal pain. Pertinent negatives include no chest pain and no headaches. Nothing aggravates the symptoms. Nothing relieves the symptoms. He has tried nothing for the symptoms.   HPI Comments: [name] is a 25 y.o. male who presents to the Emergency Department complaining of gradually worsening right flank pain onset 2 months ago that has become constant in the last 2 days. He reports associated hematuria. He adds that he has a rash to the bottom of his bilateral feet. He states that he was seen at [facility] for same symptoms but has not been able to follow up. No alleviating factors noted. Pt denies any n/v, fever, or chills.    Past Medical History  Diagnosis Date  . Renal disorder     kidney stones   History reviewed. No pertinent past surgical history. No family history on file. Social History  Substance Use Topics  . Smoking status: Current Every Day Smoker -- 0.50 packs/day  . Smokeless tobacco: None  . Alcohol Use: Yes     Comment: occasionally    Review of Systems  Constitutional: Negative for fever and chills.  HENT: Negative for rhinorrhea and sore throat.   Eyes: Negative for visual disturbance.  Respiratory: Negative for cough.   Cardiovascular: Negative for chest pain and leg swelling.  Gastrointestinal:  Positive for abdominal pain. Negative for nausea and vomiting.  Genitourinary: Positive for hematuria and flank pain.  Musculoskeletal: Positive for back pain. Negative for joint swelling.  Skin: Positive for rash.  Neurological: Negative for headaches.  Hematological: Does not bruise/bleed easily.      Allergies  Review of patient's allergies indicates no known allergies.  Home Medications   Prior to Admission medications   Medication Sig Start Date End Date Taking? Authorizing Provider  ibuprofen (ADVIL,MOTRIN) 800 MG tablet Take 1 tablet (800 mg total) by mouth every 8 (eight) hours as needed for mild pain. Patient not taking: Reported on 07/09/2015 05/15/15   Kristen N Ward, DO  naproxen (NAPROSYN) 500 MG tablet Take 1 tablet (500 mg total) by mouth 2 (two) times daily. 07/09/15   Vanetta Mulders, MD   BP 132/77 mmHg  Pulse 54  Temp(Src) 97.7 F (36.5 C) (Oral)  Resp 16  Ht  (1.676 m)  Wt 65.772 kg  BMI 23.41 kg/m2  SpO2 96% Physical Exam  Constitutional: He is oriented to person, place, and time. He appears well-developed and well-nourished. No distress.  HENT:  Head: Normocephalic and atraumatic.  Eyes: Conjunctivae and EOM are normal. Pupils are equal, round, and reactive to light. No scleral icterus.  Eyes track normal  Neck: Neck supple. No tracheal deviation present.  Cardiovascular: Normal rate, regular rhythm and normal heart sounds.   Pulmonary/Chest: Effort normal. No respiratory distress.  Lungs clear bilaterally  Abdominal: Bowel sounds are normal.  Abdomen  TTP on left side  Musculoskeletal: Normal range of motion.  No swelling  Neurological: He is alert and oriented to person, place, and time. No cranial nerve deficit. He exhibits normal muscle tone. Coordination normal.  Skin: Skin is warm and dry.  Psychiatric: He has a normal mood and affect. His behavior is normal.  Nursing note and vitals reviewed.   ED Course  Procedures (including critical  care time) DIAGNOSTIC STUDIES: Oxygen Saturation is 99% on RA, normal by my interpretation.    COORDINATION OF CARE: 8:34 AM- Pt advised of plan for treatment and pt agrees. Pt will receive CT renal and lab work for further evaluation. He will also receive IV fluids, Zofran, and fentanyl for discomfort.    Labs Review Labs Reviewed  URINALYSIS, ROUTINE W REFLEX MICROSCOPIC (NOT AT [facility]) - Abnormal; Notable for the following:    Hgb urine dipstick SMALL (*)    All other components within normal limits  BASIC METABOLIC PANEL - Abnormal; Notable for the following:    Glucose, Bld 115 (*)    Calcium 8.7 (*)    All other components within normal limits  URINE MICROSCOPIC-ADD ON - Abnormal; Notable for the following:    Squamous Epithelial / LPF 0-5 (*)    All other components within normal limits  CBC   Results for orders placed or performed during the hospital encounter of 07/09/15  Urinalysis, Routine w reflex microscopic-may I&O cath if menses (not at [facility])  Result Value Ref Range   Color, Urine YELLOW YELLOW   APPearance CLEAR CLEAR   Specific Gravity, Urine 1.010 1.005 - 1.030   pH 7.0 5.0 - 8.0   Glucose, UA NEGATIVE NEGATIVE mg/dL   Hgb urine dipstick SMALL (A) NEGATIVE   Bilirubin Urine NEGATIVE NEGATIVE   Ketones, ur NEGATIVE NEGATIVE mg/dL   Protein, ur NEGATIVE NEGATIVE mg/dL   Nitrite NEGATIVE NEGATIVE   Leukocytes, UA NEGATIVE NEGATIVE  Basic metabolic panel  Result Value Ref Range   Sodium 137 135 - 145 mmol/L   Potassium 3.6 3.5 - 5.1 mmol/L   Chloride 106 101 - 111 mmol/L   CO2 22 22 - 32 mmol/L   Glucose, Bld 115 (H) 65 - 99 mg/dL   BUN 12 6 - 20 mg/dL   Creatinine, Ser 1.610.98 0.61 - 1.24 mg/dL   Calcium 8.7 (L) 8.9 - 10.3 mg/dL   GFR calc non Af Amer >60 >60 mL/min   GFR calc Af Amer >60 >60 mL/min   Anion gap 9 5 - 15  CBC  Result Value Ref Range   WBC 9.9 4.0 - 10.5 K/uL   RBC 5.32 4.22 - 5.81 MIL/uL   Hemoglobin 14.6 13.0 - 17.0 g/dL   HCT 09.643.4 04.539.0  - 40.952.0 %   MCV 81.6 78.0 - 100.0 fL   MCH 27.4 26.0 - 34.0 pg   MCHC 33.6 30.0 - 36.0 g/dL   RDW 81.113.4 91.411.5 - 78.215.5 %   Platelets 349 150 - 400 K/uL  Urine microscopic-add on  Result Value Ref Range   Squamous Epithelial / LPF 0-5 (A) NONE SEEN   WBC, UA NONE SEEN 0 - 5 WBC/hpf   RBC / HPF 0-5 0 - 5 RBC/hpf   Bacteria, UA NONE SEEN NONE SEEN     Imaging Review Ct Renal Stone Study  07/09/2015  CLINICAL DATA:  Right flank pain, worse in the last 2 days with hematuria. History of renal stones. EXAM: CT ABDOMEN AND PELVIS WITHOUT CONTRAST TECHNIQUE:  Multidetector CT imaging of the abdomen and pelvis was performed following the standard protocol without IV contrast. COMPARISON:  05/25/2015 FINDINGS: The visualized lung bases are clear. The liver, gallbladder, spleen, adrenal glands, and pancreas have an unremarkable unenhanced appearance. A 3 mm right lower pole renal calculus is unchanged. There are 2 punctate left upper pole renal calculi measuring 1-2 mm in size, better seen than on the prior study due to beam hardening artifact on that examination. There is no hydronephrosis. No ureteral calculi or root ureteral dilatation is seen. There is no evidence of bowel obstruction. The appendix is identified in the right lower quadrant and is unremarkable. The bladder is decompressed. Prostate is unremarkable. No free fluid or enlarged lymph nodes are identified. No acute osseous abnormality is seen. IMPRESSION: 1. Unchanged bilateral nonobstructing renal calculi. 2. No acute abnormality identified in the abdomen or pelvis. Electronically Signed   By: Sebastian Ache M.D.   On: 07/09/2015 08:59     Vanetta Mulders, MD has personally reviewed and evaluated these images and lab results as part of his medical decision-making.  MDM   Final diagnoses:  Flank pain   Workup for the right-sided flank pain without evidence of ureteral stone no evidence of urinary tract infection or significant hematuria. Also  no evidence of any intra-abdominal process. May very well be musculoskeletal pain we'll treat as such.  I personally performed the services described in this documentation, which was scribed in my presence. The recorded information has been reviewed and is accurate.     Vanetta Mulders, MD 07/09/15 1020

## 2015-07-09 NOTE — ED Notes (Signed)
Pt made aware to return if symptoms worsen or if any life threatening symptoms occur.   

## 2015-07-09 NOTE — ED Notes (Signed)
Pt states he is having right side flank pain. Pt states he has been told he has kidney stones in the last several months. Pt states he pain has worsened in the last 2 days with hematuria.

## 2015-11-08 ENCOUNTER — Emergency Department (HOSPITAL_COMMUNITY)
Admission: EM | Admit: 2015-11-08 | Discharge: 2015-11-08 | Disposition: A | Discharge status: Home / Self Care | Payer: Self-pay | Attending: Emergency Medicine | Admitting: Emergency Medicine

## 2015-11-08 ENCOUNTER — Emergency Department (HOSPITAL_COMMUNITY): Payer: Self-pay

## 2015-11-08 ENCOUNTER — Encounter (HOSPITAL_COMMUNITY): Payer: Self-pay

## 2015-11-08 DIAGNOSIS — Z791 Long term (current) use of non-steroidal anti-inflammatories (NSAID): Secondary | ICD-10-CM | POA: Insufficient documentation

## 2015-11-08 DIAGNOSIS — N200 Calculus of kidney: Secondary | ICD-10-CM | POA: Insufficient documentation

## 2015-11-08 DIAGNOSIS — N390 Urinary tract infection, site not specified: Secondary | ICD-10-CM | POA: Insufficient documentation

## 2015-11-08 DIAGNOSIS — F17200 Nicotine dependence, unspecified, uncomplicated: Secondary | ICD-10-CM | POA: Insufficient documentation

## 2015-11-08 DIAGNOSIS — Z79899 Other long term (current) drug therapy: Secondary | ICD-10-CM | POA: Insufficient documentation

## 2015-11-08 DIAGNOSIS — N201 Calculus of ureter: Secondary | ICD-10-CM | POA: Insufficient documentation

## 2015-11-08 DIAGNOSIS — R319 Hematuria, unspecified: Secondary | ICD-10-CM | POA: Insufficient documentation

## 2015-11-08 LAB — COMPREHENSIVE METABOLIC PANEL
ALT: 12 U/L — ABNORMAL LOW (ref 17–63)
ANION GAP: 7 (ref 5–15)
AST: 14 U/L — ABNORMAL LOW (ref 15–41)
Albumin: 4.5 g/dL (ref 3.5–5.0)
Alkaline Phosphatase: 73 U/L (ref 38–126)
BUN: 16 mg/dL (ref 6–20)
CALCIUM: 8.8 mg/dL — AB (ref 8.9–10.3)
CO2: 26 mmol/L (ref 22–32)
Chloride: 105 mmol/L (ref 101–111)
Creatinine, Ser: 1.04 mg/dL (ref 0.61–1.24)
GFR calc Af Amer: >60 mL/min (ref >60)
GFR calc non Af Amer: >60 mL/min (ref >60)
GLUCOSE: 110 mg/dL — AB (ref 65–99)
Potassium: 3.7 mmol/L (ref 3.5–5.1)
Sodium: 138 mmol/L (ref 135–145)
Total Bilirubin: 0.7 mg/dL (ref 0.3–1.2)
Total Protein: 7.1 g/dL (ref 6.5–8.1)

## 2015-11-08 LAB — URINALYSIS, ROUTINE W REFLEX MICROSCOPIC
Bilirubin Urine: NEGATIVE
Glucose, UA: NEGATIVE mg/dL
Leukocytes, UA: NEGATIVE
Nitrite: NEGATIVE
pH: 6 (ref 5.0–8.0)

## 2015-11-08 LAB — CBC WITH DIFFERENTIAL/PLATELET
Basophils Absolute: 0 10*3/uL (ref 0.0–0.1)
Basophils Relative: 0 %
EOS PCT: 0 %
Eosinophils Absolute: 0.1 10*3/uL (ref 0.0–0.7)
HEMATOCRIT: 41.6 % (ref 39.0–52.0)
Hemoglobin: 13.9 g/dL (ref 13.0–17.0)
LYMPHS ABS: 2.5 10*3/uL (ref 0.7–4.0)
LYMPHS PCT: 13 %
MCH: 27.6 pg (ref 26.0–34.0)
MCHC: 33.4 g/dL (ref 30.0–36.0)
MCV: 82.5 fL (ref 78.0–100.0)
MONO ABS: 1.1 10*3/uL — AB (ref 0.1–1.0)
MONOS PCT: 6 %
NEUTROS ABS: 15.8 10*3/uL — AB (ref 1.7–7.7)
Neutrophils Relative %: 81 %
PLATELETS: 298 10*3/uL (ref 150–400)
RBC: 5.04 MIL/uL (ref 4.22–5.81)
RDW: 13.4 % (ref 11.5–15.5)
WBC: 19.5 10*3/uL — ABNORMAL HIGH (ref 4.0–10.5)

## 2015-11-08 LAB — URINE MICROSCOPIC-ADD ON
Squamous Epithelial / LPF: NONE SEEN
WBC, UA: NONE SEEN WBC/hpf (ref 0–5)

## 2015-11-08 MED ORDER — CEPHALEXIN 500 MG PO CAPS: ORAL_CAPSULE | ORAL | 0 refills | Status: DC

## 2015-11-08 MED ORDER — IBUPROFEN 800 MG PO TABS: 800.0000 mg | ORAL_TABLET | Freq: Three times a day (TID) | ORAL | 0 refills | Status: AC

## 2015-11-08 MED ORDER — SODIUM CHLORIDE 0.9 % IV BOLUS (SEPSIS)
1000.0000 mL | Freq: Once | INTRAVENOUS | Status: AC
  Administered 2015-11-08: 1000 mL via INTRAVENOUS

## 2015-11-08 MED ORDER — TAMSULOSIN HCL 0.4 MG PO CAPS: 0.4000 mg | ORAL_CAPSULE | Freq: Every day | ORAL | 0 refills | Status: DC

## 2015-11-08 MED ORDER — TAMSULOSIN HCL 0.4 MG PO CAPS
0.4000 mg | ORAL_CAPSULE | Freq: Once | ORAL | Status: AC
  Administered 2015-11-08: 0.4 mg via ORAL
  Filled 2015-11-08: qty 1

## 2015-11-08 MED ORDER — HYDROCODONE-ACETAMINOPHEN 5-325 MG PO TABS: 1.0000 | ORAL_TABLET | Freq: Four times a day (QID) | ORAL | 0 refills | Status: DC | PRN

## 2015-11-08 MED ORDER — HYDROMORPHONE HCL 1 MG/ML IJ SOLN
1.0000 mg | Freq: Once | INTRAMUSCULAR | Status: AC
  Administered 2015-11-08: 1 mg via INTRAVENOUS
  Filled 2015-11-08: qty 1

## 2015-11-08 MED ORDER — CEFTRIAXONE SODIUM 1 G IJ SOLR
1.0000 g | Freq: Once | INTRAMUSCULAR | Status: AC
  Administered 2015-11-08: 1 g via INTRAVENOUS
  Filled 2015-11-08: qty 10

## 2015-11-08 MED ORDER — KETOROLAC TROMETHAMINE 30 MG/ML IJ SOLN
30.0000 mg | Freq: Once | INTRAMUSCULAR | Status: AC
  Administered 2015-11-08: 30 mg via INTRAVENOUS
  Filled 2015-11-08: qty 1

## 2015-11-08 NOTE — ED Triage Notes (Addendum)
Pt c/o r flank pain, hematuria,  and n/v since yesterday. Reports history of kidney stones.  Pt vomited with ems so they administered  zofran IV.

## 2015-11-08 NOTE — ED Provider Notes (Signed)
[facility] DEPT Provider Note   CSN: [id number] Arrival date & time: 11/08/15  0730  First Provider Contact:  First MD Initiated Contact with Patient 11/08/15 [phone]        History   Chief Complaint Chief Complaint  Patient presents with  . Flank Pain    HPI [name] is a 25 y.o. male.   Abdominal Pain   This is a recurrent problem. The current episode started yesterday. The problem occurs constantly. The problem has been gradually worsening. The pain is located in the RLQ. The pain is moderate. Associated symptoms include nausea, dysuria, frequency and hematuria. The symptoms are aggravated by activity. Nothing relieves the symptoms. Past workup includes CT scan. Past medical history comments: kidney stone.    Past Medical History:  Diagnosis Date  . Renal disorder    kidney stones    There are no active problems to display for this patient.   History reviewed. No pertinent surgical history.     Home Medications    Prior to Admission medications   Medication Sig Start Date End Date Taking? Authorizing Provider  cephALEXin (KEFLEX) 500 MG capsule 2 caps po bid x 7 days 11/08/15   Marily MemosJason Jalan Bodi, MD  HYDROcodone-acetaminophen (NORCO/VICODIN) 5-325 MG tablet Take 1-2 tablets by mouth every 6 (six) hours as needed for severe pain. 11/08/15   Marily MemosJason Makeshia Seat, MD  ibuprofen (ADVIL,MOTRIN) 800 MG tablet Take 1 tablet (800 mg total) by mouth 3 (three) times daily. 11/08/15 11/15/15  Marily MemosJason Neeya Prigmore, MD  tamsulosin (FLOMAX) 0.4 MG CAPS capsule Take 1 capsule (0.4 mg total) by mouth daily. Until stone passes. 11/08/15   Marily MemosJason Willmer Fellers, MD    Family History No family history on file.  Social History Social History  Substance Use Topics  . Smoking status: Current Every Day Smoker    Packs/day: 0.50  . Smokeless tobacco: Not on file  . Alcohol use Yes     Comment: occasionally     Allergies   Review of patient's allergies indicates no known allergies.   Review of  Systems Review of Systems  Gastrointestinal: Positive for abdominal pain and nausea.  Genitourinary: Positive for dysuria, frequency, hematuria and testicular pain.  All other systems reviewed and are negative.    Physical Exam Updated Vital Signs BP (!) 118/49   Pulse (!) 52   Temp 98.7 F (37.1 C) (Oral)   Resp 16   Ht 5\' 6"  (1.676 m)   Wt 146 lb (66.2 kg)   SpO2 97%   BMI 23.57 kg/m   Physical Exam  Constitutional: He appears well-developed and well-nourished.  HENT:  Head: Normocephalic and atraumatic.  Eyes: Conjunctivae are normal.  Neck: Neck supple.  Cardiovascular: Normal rate and regular rhythm.   No murmur heard. Pulmonary/Chest: Effort normal and breath sounds normal. No respiratory distress.  Abdominal: Soft. There is no tenderness. Hernia confirmed negative in the right inguinal area and confirmed negative in the left inguinal area.  Genitourinary: Testes normal. Cremasteric reflex is present. Right testis shows no mass and no tenderness. Left testis shows no mass and no tenderness. No penile tenderness. No discharge found.  Musculoskeletal: He exhibits tenderness. He exhibits no edema.  Right CVA ttp  Neurological: He is alert.  Skin: Skin is warm and dry.  Psychiatric: He has a normal mood and affect.  Nursing note and vitals reviewed.    ED Treatments / Results  Labs (all labs ordered are listed, but only abnormal results are displayed) Labs Reviewed  URINALYSIS, ROUTINE W REFLEX MICROSCOPIC (NOT AT [facility]) - Abnormal; Notable for the following:       Result Value   APPearance HAZY (*)    Specific Gravity, Urine >1.030 (*)    Hgb urine dipstick LARGE (*)    Ketones, ur TRACE (*)    Protein, ur TRACE (*)    All other components within normal limits  CBC WITH DIFFERENTIAL/PLATELET - Abnormal; Notable for the following:    WBC 19.5 (*)    Neutro Abs 15.8 (*)    Monocytes Absolute 1.1 (*)    All other components within normal limits  COMPREHENSIVE  METABOLIC PANEL - Abnormal; Notable for the following:    Glucose, Bld 110 (*)    Calcium 8.8 (*)    AST 14 (*)    ALT 12 (*)    All other components within normal limits  URINE MICROSCOPIC-ADD ON - Abnormal; Notable for the following:    Bacteria, UA RARE (*)    All other components within normal limits  URINE CULTURE    EKG  EKG Interpretation None       Radiology Ct Renal Stone Study  Result Date: 11/08/2015 CLINICAL DATA:  Right-sided flank pain and hematuria EXAM: CT ABDOMEN AND PELVIS WITHOUT CONTRAST TECHNIQUE: Multidetector CT imaging of the abdomen and pelvis was performed following the standard protocol without IV contrast. COMPARISON:  07/09/2015 FINDINGS: Lower chest:  No acute findings. Hepatobiliary: No mass visualized on this un-enhanced exam. Pancreas: No mass or inflammatory process identified on this un-enhanced exam. Spleen: Within normal limits in size. Adrenals/Urinary Tract: The adrenal glands are within normal limits. Stable nonobstructing left renal stones are noted. The previously seen lower pole right renal stone has migrated into the distal right ureter. It measures approximately 4 mm in greatest dimension. Only minimal fullness of the right ureter is noted. The bladder is decompressed. Stomach/Bowel: No evidence of obstruction, inflammatory process, or abnormal fluid collections. The appendix is within normal limits. Vascular/Lymphatic: No pathologically enlarged lymph nodes. No evidence of abdominal aortic aneurysm. Reproductive: No mass or other significant abnormality. Other: None. Musculoskeletal:  No suspicious bone lesions identified. IMPRESSION: Nonobstructing left renal stones. Migration of a previously seen right renal stone into the distal right ureter with only mild obstructive changes. Electronically Signed   By: Alcide Clever M.D.   On: 11/08/2015 09:16    Procedures Procedures (including critical care time)  Medications Ordered in ED Medications    sodium chloride 0.9 % bolus 1,000 mL (0 mLs Intravenous Stopped 11/08/15 0900)  HYDROmorphone (DILAUDID) injection 1 mg (1 mg Intravenous Given 11/08/15 0802)  ketorolac (TORADOL) 30 MG/ML injection 30 mg (30 mg Intravenous Given 11/08/15 0801)  tamsulosin (FLOMAX) capsule 0.4 mg (0.4 mg Oral Given 11/08/15 0804)  cefTRIAXone (ROCEPHIN) 1 g in dextrose 5 % 50 mL IVPB (0 g Intravenous Stopped 11/08/15 0929)  HYDROmorphone (DILAUDID) injection 1 mg (1 mg Intravenous Given 11/08/15 0907)     Initial Impression / Assessment and Plan / ED Course  I have reviewed the triage vital signs and the nursing notes.  Pertinent labs & imaging results that were available during my care of the patient were reviewed by me and considered in my medical decision making (see chart for details).  Renal colic v pyelo. Will eval appropriately.   Clinical Course  Value Comment By Time  WBC: (!) 19.5 Leukocytosis with high neutrophil count. Urine with bacteria. This in concert with his vomiting and flank pain make pyelo likely. Is  still pendign CT which should help delineate further. Rocephin started Marily Memos, MD 08/10 [phone]   CT read by myself with distal right ureteral stone with some associated obstruction. Likely the cause for symptoms. Will need outpatient pain meds, abx, flomax and urology follow up if not improving.  Marily Memos, MD 08/10 [phone]     Final Clinical Impressions(s) / ED Diagnoses   Final diagnoses:  Ureterolithiasis  Kidney stone  UTI (lower urinary tract infection)    New Prescriptions Discharge Medication List as of 11/08/2015 10:24 AM    START taking these medications   Details  cephALEXin (KEFLEX) 500 MG capsule 2 caps po bid x 7 days, Print    HYDROcodone-acetaminophen (NORCO/VICODIN) 5-325 MG tablet Take 1-2 tablets by mouth every 6 (six) hours as needed for severe pain., Starting Thu 11/08/2015, Print    tamsulosin (FLOMAX) 0.4 MG CAPS capsule Take 1 capsule (0.4 mg total) by  mouth daily. Until stone passes., Starting Thu 11/08/2015, Print         Marily Memos, MD 11/09/15 1310

## 2015-11-08 NOTE — ED Notes (Signed)
Patient transported to CT 

## 2015-11-09 LAB — URINE CULTURE: CULTURE: NO GROWTH

## 2015-12-29 ENCOUNTER — Emergency Department (HOSPITAL_COMMUNITY)
Admission: EM | Admit: 2015-12-29 | Discharge: 2015-12-29 | Disposition: A | Discharge status: Home / Self Care | Payer: Self-pay | Attending: Emergency Medicine | Admitting: Emergency Medicine

## 2015-12-29 ENCOUNTER — Encounter (HOSPITAL_COMMUNITY): Payer: Self-pay

## 2015-12-29 DIAGNOSIS — F17200 Nicotine dependence, unspecified, uncomplicated: Secondary | ICD-10-CM | POA: Insufficient documentation

## 2015-12-29 DIAGNOSIS — L739 Follicular disorder, unspecified: Secondary | ICD-10-CM | POA: Insufficient documentation

## 2015-12-29 DIAGNOSIS — Z79899 Other long term (current) drug therapy: Secondary | ICD-10-CM | POA: Insufficient documentation

## 2015-12-29 MED ORDER — SULFAMETHOXAZOLE-TRIMETHOPRIM 800-160 MG PO TABS: 1.0000 | ORAL_TABLET | Freq: Two times a day (BID) | ORAL | 0 refills | Status: AC

## 2015-12-29 NOTE — ED Triage Notes (Signed)
Pt states the left side of his jaw started feeling swollen 3 hour ago, denies pain

## 2015-12-29 NOTE — ED Notes (Signed)
Pt has a knot to the left side of his face.

## 2015-12-29 NOTE — ED Provider Notes (Signed)
[facility] Provider Note   CSN: [id number] Arrival date & time: 12/29/15  0224     History   Chief Complaint Chief Complaint  Patient presents with  . Facial Swelling    HPI [name] is a 25 y.o. male.  Presents to the ER with concerns over 3 small bumps on the left side of his face that are swollen and tender to the touch. No drainage noted. No dental pain. He has not had fever. Denies injury.      Past Medical History:  Diagnosis Date  . Renal disorder    kidney stones    There are no active problems to display for this patient.   History reviewed. No pertinent surgical history.     Home Medications    Prior to Admission medications   Medication Sig Start Date End Date Taking? Authorizing Provider  tamsulosin (FLOMAX) 0.4 MG CAPS capsule Take 1 capsule (0.4 mg total) by mouth daily. Until stone passes. 11/08/15  Yes Marily Memos, MD  sulfamethoxazole-trimethoprim (BACTRIM DS,SEPTRA DS) 800-160 MG tablet Take 1 tablet by mouth 2 (two) times daily. 12/29/15 01/05/16  Gilda Crease, MD    Family History No family history on file.  Social History Social History  Substance Use Topics  . Smoking status: Current Every Day Smoker    Packs/day: 0.50  . Smokeless tobacco: Never Used  . Alcohol use Yes     Comment: occasionally     Allergies   Review of patient's allergies indicates no known allergies.   Review of Systems Review of Systems  Skin: Positive for rash.  All other systems reviewed and are negative.    Physical Exam Updated Vital Signs BP 132/77 (BP Location: Left Arm)   Pulse 92   Temp 97.9 F (36.6 C) (Oral)   Resp 16   Ht 5\' 6"  (1.676 m)   Wt 153 lb (69.4 kg)   SpO2 98%   BMI 24.69 kg/m   Physical Exam  Constitutional: He is oriented to person, place, and time. He appears well-developed and well-nourished. No distress.  HENT:  Head: Normocephalic and atraumatic.  Right Ear: Hearing normal.  Left Ear:  Hearing normal.  Nose: Nose normal.  Mouth/Throat: Oropharynx is clear and moist and mucous membranes are normal.  Eyes: Conjunctivae and EOM are normal. Pupils are equal, round, and reactive to light.  Neck: Normal range of motion. Neck supple.  Cardiovascular: Regular rhythm, S1 normal and S2 normal.  Exam reveals no gallop and no friction rub.   No murmur heard. Pulmonary/Chest: Effort normal and breath sounds normal. No respiratory distress. He exhibits no tenderness.  Abdominal: Soft. Normal appearance and bowel sounds are normal. There is no hepatosplenomegaly. There is no tenderness. There is no rebound, no guarding, no tenderness at McBurney's point and negative Murphy's sign. No hernia.  Musculoskeletal: Normal range of motion.  Neurological: He is alert and oriented to person, place, and time. He has normal strength. No cranial nerve deficit or sensory deficit. Coordination normal. GCS eye subscore is 4. GCS verbal subscore is 5. GCS motor subscore is 6.  Skin: Skin is warm, dry and intact. No rash noted. No cyanosis.  3 small erythematous papules left side of face with mild tenderness, no fluctuance.  Psychiatric: He has a normal mood and affect. His speech is normal and behavior is normal. Thought content normal.  Nursing note and vitals reviewed.    ED Treatments / Results  Labs (all labs ordered are listed,  but only abnormal results are displayed) Labs Reviewed - No data to display  EKG  EKG Interpretation None       Radiology No results found.  Procedures Procedures (including critical care time)  Medications Ordered in ED Medications - No data to display   Initial Impression / Assessment and Plan / ED Course  I have reviewed the triage vital signs and the nursing notes.  Pertinent labs & imaging results that were available during my care of the patient were reviewed by me and considered in my medical decision making (see chart for details).  Clinical  Course    3 small areas that appear consistent with early folliculitis. No evidence of drainable abscess.  Final Clinical Impressions(s) / ED Diagnoses   Final diagnoses:  Folliculitis    New Prescriptions New Prescriptions   SULFAMETHOXAZOLE-TRIMETHOPRIM (BACTRIM DS,SEPTRA DS) 800-160 MG TABLET    Take 1 tablet by mouth 2 (two) times daily.     Gilda Creasehristopher J Royston Bekele, MD 12/29/15 [phone]

## 2015-12-29 NOTE — ED Notes (Signed)
Pt alert & oriented x4, stable gait. Patient given discharge instructions, paperwork & prescription(s). Patient  instructed to stop at the registration desk to finish any additional paperwork. Patient verbalized understanding. Pt left department w/ no further questions. 

## 2016-01-23 ENCOUNTER — Encounter (HOSPITAL_COMMUNITY): Payer: Self-pay | Admitting: Emergency Medicine

## 2016-01-23 ENCOUNTER — Emergency Department (HOSPITAL_COMMUNITY)
Admission: EM | Admit: 2016-01-23 | Discharge: 2016-01-24 | Disposition: A | Discharge status: Home / Self Care | Payer: Self-pay | Attending: Emergency Medicine | Admitting: Emergency Medicine

## 2016-01-23 DIAGNOSIS — S0083XA Contusion of other part of head, initial encounter: Secondary | ICD-10-CM | POA: Insufficient documentation

## 2016-01-23 DIAGNOSIS — F17200 Nicotine dependence, unspecified, uncomplicated: Secondary | ICD-10-CM | POA: Insufficient documentation

## 2016-01-23 DIAGNOSIS — Y929 Unspecified place or not applicable: Secondary | ICD-10-CM | POA: Insufficient documentation

## 2016-01-23 DIAGNOSIS — Y999 Unspecified external cause status: Secondary | ICD-10-CM | POA: Insufficient documentation

## 2016-01-23 DIAGNOSIS — Y9389 Activity, other specified: Secondary | ICD-10-CM | POA: Insufficient documentation

## 2016-01-23 DIAGNOSIS — S0990XA Unspecified injury of head, initial encounter: Secondary | ICD-10-CM

## 2016-01-23 MED ORDER — KETOROLAC TROMETHAMINE 30 MG/ML IJ SOLN
30.0000 mg | Freq: Once | INTRAMUSCULAR | Status: AC
  Administered 2016-01-24: 30 mg via INTRAVENOUS
  Filled 2016-01-23: qty 1
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
## 2016-01-24 ENCOUNTER — Emergency Department (HOSPITAL_COMMUNITY): Payer: Self-pay

## 2016-01-24 NOTE — Discharge Instructions (Signed)
Ice packs to the injured areas and to help keep the swelling down. You can take ibuprofen 600 mg + acetaminophen 1000 mg every 6 hrs as needed for pain. Return to the ED for any problems listed on the head injury sheet.

## 2016-09-12 ENCOUNTER — Encounter (HOSPITAL_COMMUNITY): Payer: Self-pay

## 2016-09-12 ENCOUNTER — Emergency Department (HOSPITAL_COMMUNITY)
Admission: EM | Admit: 2016-09-12 | Discharge: 2016-09-13 | Disposition: A | Discharge status: Home / Self Care | Payer: Self-pay | Attending: Emergency Medicine | Admitting: Emergency Medicine

## 2016-09-12 DIAGNOSIS — A94 Unspecified arthropod-borne viral fever: Secondary | ICD-10-CM | POA: Insufficient documentation

## 2016-09-12 DIAGNOSIS — Z79899 Other long term (current) drug therapy: Secondary | ICD-10-CM | POA: Insufficient documentation

## 2016-09-12 DIAGNOSIS — Y929 Unspecified place or not applicable: Secondary | ICD-10-CM | POA: Insufficient documentation

## 2016-09-12 DIAGNOSIS — Y999 Unspecified external cause status: Secondary | ICD-10-CM | POA: Insufficient documentation

## 2016-09-12 DIAGNOSIS — R34 Anuria and oliguria: Secondary | ICD-10-CM | POA: Insufficient documentation

## 2016-09-12 DIAGNOSIS — W57XXXA Bitten or stung by nonvenomous insect and other nonvenomous arthropods, initial encounter: Secondary | ICD-10-CM | POA: Insufficient documentation

## 2016-09-12 DIAGNOSIS — Z9189 Other specified personal risk factors, not elsewhere classified: Secondary | ICD-10-CM

## 2016-09-12 DIAGNOSIS — S70362A Insect bite (nonvenomous), left thigh, initial encounter: Secondary | ICD-10-CM | POA: Insufficient documentation

## 2016-09-12 DIAGNOSIS — F17200 Nicotine dependence, unspecified, uncomplicated: Secondary | ICD-10-CM | POA: Insufficient documentation

## 2016-09-12 DIAGNOSIS — Y939 Activity, unspecified: Secondary | ICD-10-CM | POA: Insufficient documentation

## 2016-09-12 LAB — COMPREHENSIVE METABOLIC PANEL
ALT: 14 U/L — ABNORMAL LOW (ref 17–63)
AST: 17 U/L (ref 15–41)
Albumin: 4.3 g/dL (ref 3.5–5.0)
Alkaline Phosphatase: 76 U/L (ref 38–126)
Anion gap: 10 (ref 5–15)
BUN: 15 mg/dL (ref 6–20)
CO2: 20 mmol/L — ABNORMAL LOW (ref 22–32)
Calcium: 8.7 mg/dL — ABNORMAL LOW (ref 8.9–10.3)
Chloride: 105 mmol/L (ref 101–111)
Creatinine, Ser: 1.14 mg/dL (ref 0.61–1.24)
GFR calc Af Amer: >60 mL/min (ref >60)
GFR calc non Af Amer: >60 mL/min (ref >60)
Glucose, Bld: 94 mg/dL (ref 65–99)
Potassium: 3.3 mmol/L — ABNORMAL LOW (ref 3.5–5.1)
Sodium: 135 mmol/L (ref 135–145)
Total Bilirubin: 0.5 mg/dL (ref 0.3–1.2)
Total Protein: 7.2 g/dL (ref 6.5–8.1)

## 2016-09-12 LAB — CBC WITH DIFFERENTIAL/PLATELET
Basophils Absolute: 0 10*3/uL (ref 0.0–0.1)
Basophils Relative: 0 %
Eosinophils Absolute: 0 10*3/uL (ref 0.0–0.7)
Eosinophils Relative: 0 %
HCT: 41.5 % (ref 39.0–52.0)
Hemoglobin: 14 g/dL (ref 13.0–17.0)
Lymphocytes Relative: 38 %
Lymphs Abs: 1.3 10*3/uL (ref 0.7–4.0)
MCH: 27.4 pg (ref 26.0–34.0)
MCHC: 33.7 g/dL (ref 30.0–36.0)
MCV: 81.2 fL (ref 78.0–100.0)
Monocytes Absolute: 0.3 10*3/uL (ref 0.1–1.0)
Monocytes Relative: 9 %
Neutro Abs: 1.8 10*3/uL (ref 1.7–7.7)
Neutrophils Relative %: 53 %
Platelets: 163 10*3/uL (ref 150–400)
RBC: 5.11 MIL/uL (ref 4.22–5.81)
RDW: 13.3 % (ref 11.5–15.5)
WBC: 3.4 10*3/uL — ABNORMAL LOW (ref 4.0–10.5)

## 2016-09-12 LAB — LACTIC ACID, PLASMA: Lactic Acid, Venous: 0.6 mmol/L (ref 0.5–1.9)

## 2016-09-12 LAB — CK: Total CK: 73 U/L (ref 49–397)

## 2016-09-12 MED ORDER — ONDANSETRON HCL 4 MG/2ML IJ SOLN
4.0000 mg | Freq: Once | INTRAMUSCULAR | Status: AC
  Administered 2016-09-12: 4 mg via INTRAVENOUS
  Filled 2016-09-12: qty 2

## 2016-09-12 MED ORDER — SODIUM CHLORIDE 0.9 % IV BOLUS (SEPSIS)
1000.0000 mL | Freq: Once | INTRAVENOUS | Status: AC
  Administered 2016-09-12: 1000 mL via INTRAVENOUS

## 2016-09-12 MED ORDER — KETOROLAC TROMETHAMINE 30 MG/ML IJ SOLN
30.0000 mg | Freq: Once | INTRAMUSCULAR | Status: AC
  Administered 2016-09-12: 30 mg via INTRAVENOUS
  Filled 2016-09-12: qty 1
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
## 2016-09-12 NOTE — ED Provider Notes (Signed)
[facility]-EMERGENCY DEPT Provider Note   CSN: [id number] Arrival date & time: 09/12/16  2219     History   Chief Complaint Chief Complaint  Patient presents with  . Fever    tick bites    HPI [name] is a 26 y.o. male.  HPI   [name] is a 26 y.o. male brought in by EMS, who presents to the Emergency Department complaining of generalized body aches, fever, chills, headaches and nausea.  He states that he removed 4 ticks from his left thigh 4 days ago.  Unsure how long the ticks were attached.  He began to develop symptoms later that evening.  He describes frontal headache that is throbbing in quality, dark orange to brown colored urine, arthralgias, decreased appetite and some nausea.  Denies neck pain or stiffness, vomiting or diarrhea, and rash.  Pt was given 1 gram tylenol en route .    Past Medical History:  Diagnosis Date  . Renal disorder    kidney stones    There are no active problems to display for this patient.   History reviewed. No pertinent surgical history.     Home Medications    Prior to Admission medications   Medication Sig Start Date End Date Taking? Authorizing Provider  acetaminophen (TYLENOL) 500 MG tablet Take 1,000 mg by mouth every 6 (six) hours as needed.   Yes [provider]  ibuprofen (ADVIL,MOTRIN) 200 MG tablet Take 200 mg by mouth every 6 (six) hours as needed.   Yes [provider]    Family History No family history on file.  Social History Social History  Substance Use Topics  . Smoking status: Current Every Day Smoker    Packs/day: 0.50  . Smokeless tobacco: Never Used  . Alcohol use Yes     Comment: occasionally     Allergies   Patient has no known allergies.   Review of Systems Review of Systems  Constitutional: Positive for appetite change, chills, fatigue and fever. Negative for activity change.  HENT: Negative for congestion, sore throat and trouble swallowing.   Eyes: Negative for  visual disturbance.  Respiratory: Negative for cough, chest tightness, shortness of breath and wheezing.   Cardiovascular: Negative for chest pain.  Gastrointestinal: Positive for nausea. Negative for abdominal pain and vomiting.  Genitourinary: Positive for decreased urine volume. Negative for dysuria and frequency.  Musculoskeletal: Positive for arthralgias and myalgias. Negative for neck pain and neck stiffness.  Skin: Negative for color change and rash.       Recent tick bites to the left thigh  Neurological: Positive for weakness (generalized weakness) and headaches. Negative for dizziness, syncope and numbness.  Hematological: Negative for adenopathy.     Physical Exam Updated Vital Signs BP 111/61 (BP Location: Left Arm)   Pulse 93   Temp (!) 101.8 F (38.8 C) (Oral)   Resp 18   Ht 5\' 6"  (1.676 m)   Wt 69.9 kg (154 lb)   SpO2 97%   BMI 24.86 kg/m   Physical Exam  Constitutional: He is oriented to person, place, and time. He has a sickly appearance.  HENT:  Mouth/Throat: Uvula is midline and oropharynx is clear and moist. Mucous membranes are dry.  Eyes: Conjunctivae and EOM are normal.  Neck: Trachea normal, normal range of motion and phonation normal. Neck supple. No Kernig's sign noted.  Cardiovascular: Normal rate, regular rhythm and intact distal pulses.   Pulmonary/Chest: Effort normal and breath sounds normal. No respiratory  distress.  Abdominal: Soft. He exhibits no distension. There is no tenderness.  Musculoskeletal: Normal range of motion.  Lymphadenopathy:    He has no cervical adenopathy.  Neurological: He is alert and oriented to person, place, and time. No sensory deficit.  Skin: Skin is warm. Capillary refill takes less than 2 seconds. No rash noted.  Four small erythmatous papules to the left thigh.  No surrounding erythema or central clearing.     Nursing note and vitals reviewed.    ED Treatments / Results  Labs (all labs ordered are listed, but  only abnormal results are displayed) Labs Reviewed  URINALYSIS, ROUTINE W REFLEX MICROSCOPIC - Abnormal; Notable for the following:       Result Value   Hgb urine dipstick MODERATE (*)    All other components within normal limits  COMPREHENSIVE METABOLIC PANEL - Abnormal; Notable for the following:    Potassium 3.3 (*)    CO2 20 (*)    Calcium 8.7 (*)    ALT 14 (*)    All other components within normal limits  CBC WITH DIFFERENTIAL/PLATELET - Abnormal; Notable for the following:    WBC 3.4 (*)    All other components within normal limits  LACTIC ACID, PLASMA  CK  ROCKY MTN SPOTTED FVR ABS PNL(IGG+IGM)  B. BURGDORFI ANTIBODIES    EKG  EKG Interpretation None       Radiology No results found.  Procedures Procedures (including critical care time)  Medications Ordered in ED Medications  sodium chloride 0.9 % bolus 1,000 mL (0 mLs Intravenous Stopped 09/13/16 0002)  ketorolac (TORADOL) 30 MG/ML injection 30 mg (30 mg Intravenous Given 09/12/16 2311)  ondansetron (ZOFRAN) injection 4 mg (4 mg Intravenous Given 09/12/16 2313)  doxycycline (VIBRA-TABS) tablet 100 mg (100 mg Oral Given 09/13/16 0114)     Initial Impression / Assessment and Plan / ED Course  I have reviewed the triage vital signs and the nursing notes.  Pertinent labs & imaging results that were available during my care of the patient were reviewed by me and considered in my medical decision making (see chart for details).     On recheck, pt is feeling better after medications, IVF's.  Vitals stable.  Non-toxic.  High suspicion for tick related illness.  I will start doxy, pt appears stable for d/c, return precautions discussed.   Final Clinical Impressions(s) / ED Diagnoses   Final diagnoses:  Tick bite, initial encounter  At high risk for tick borne illness    New Prescriptions Discharge Medication List as of 09/13/2016  1:08 AM    START taking these medications   Details  doxycycline (VIBRAMYCIN)  100 MG capsule Take 1 capsule (100 mg total) by mouth 2 (two) times daily. For 10 days, Starting Sat 09/13/2016, Print         Pauline Aus, [place] 09/13/16 1356    Bethann Berkshire, MD 09/13/16 2348

## 2016-09-13 LAB — URINALYSIS, ROUTINE W REFLEX MICROSCOPIC
BACTERIA UA: NONE SEEN
Bilirubin Urine: NEGATIVE
Glucose, UA: NEGATIVE mg/dL
Ketones, ur: NEGATIVE mg/dL
Leukocytes, UA: NEGATIVE
Nitrite: NEGATIVE
PROTEIN: NEGATIVE mg/dL
Specific Gravity, Urine: 1.021 (ref 1.005–1.030)
Squamous Epithelial / LPF: NONE SEEN
pH: 5 (ref 5.0–8.0)

## 2016-09-13 MED ORDER — DOXYCYCLINE HYCLATE 100 MG PO TABS
100.0000 mg | ORAL_TABLET | Freq: Once | ORAL | Status: AC
  Administered 2016-09-13: 100 mg via ORAL
  Filled 2016-09-13: qty 1

## 2016-09-13 MED ORDER — IBUPROFEN 600 MG PO TABS: 600.0000 mg | ORAL_TABLET | Freq: Four times a day (QID) | ORAL | 0 refills | Status: AC | PRN

## 2016-09-13 MED ORDER — DOXYCYCLINE HYCLATE 100 MG PO CAPS: 100.0000 mg | ORAL_CAPSULE | Freq: Two times a day (BID) | ORAL | 0 refills | Status: AC

## 2016-09-13 NOTE — Discharge Instructions (Signed)
Drink plenty of fluids.  Alternate tylenol and the ibuprofen for fever.  Take the antibiotic as directed until its finished.  Return here for any worsening symptoms

## 2016-09-15 LAB — B. BURGDORFI ANTIBODIES

## 2016-09-16 LAB — RMSF, IGG, IFA

## 2016-09-16 LAB — ROCKY MTN SPOTTED FVR ABS PNL(IGG+IGM)
RMSF IGG: POSITIVE — AB
RMSF IgM: 0.98 index — ABNORMAL HIGH (ref 0.00–0.89)

## 2018-05-23 ENCOUNTER — Encounter (HOSPITAL_COMMUNITY): Payer: Self-pay | Admitting: Emergency Medicine

## 2018-05-23 ENCOUNTER — Other Ambulatory Visit: Payer: Self-pay

## 2018-05-23 ENCOUNTER — Emergency Department (HOSPITAL_COMMUNITY)
Admission: EM | Admit: 2018-05-23 | Discharge: 2018-05-23 | Discharge status: Against Medical Advice | Payer: Self-pay | Attending: Emergency Medicine | Admitting: Emergency Medicine

## 2018-05-23 DIAGNOSIS — N50812 Left testicular pain: Secondary | ICD-10-CM | POA: Insufficient documentation

## 2018-05-23 DIAGNOSIS — Z5321 Procedure and treatment not carried out due to patient leaving prior to being seen by health care provider: Secondary | ICD-10-CM | POA: Insufficient documentation

## 2018-05-23 NOTE — ED Triage Notes (Signed)
Patient complains of testicular pain x 3 days. States pain is worse in left testicle. States he has not been able to urinate. When he does there is blood in urine. States last time he urinated was 9am. States that he has been nauseous and unable to eat in 3 days.

## 2019-05-21 ENCOUNTER — Emergency Department (HOSPITAL_COMMUNITY): Payer: Self-pay

## 2019-05-21 ENCOUNTER — Encounter (HOSPITAL_COMMUNITY): Payer: Self-pay | Admitting: Emergency Medicine

## 2019-05-21 ENCOUNTER — Emergency Department (HOSPITAL_COMMUNITY)
Admission: EM | Admit: 2019-05-21 | Discharge: 2019-05-21 | Disposition: A | Discharge status: Home / Self Care | Payer: Self-pay | Attending: Emergency Medicine | Admitting: Emergency Medicine

## 2019-05-21 ENCOUNTER — Other Ambulatory Visit: Payer: Self-pay

## 2019-05-21 DIAGNOSIS — F17210 Nicotine dependence, cigarettes, uncomplicated: Secondary | ICD-10-CM | POA: Insufficient documentation

## 2019-05-21 DIAGNOSIS — K219 Gastro-esophageal reflux disease without esophagitis: Secondary | ICD-10-CM

## 2019-05-21 DIAGNOSIS — Z79899 Other long term (current) drug therapy: Secondary | ICD-10-CM | POA: Insufficient documentation

## 2019-05-21 LAB — DIFFERENTIAL
Abs Immature Granulocytes: 0.06 10*3/uL (ref 0.00–0.07)
Basophils Absolute: 0.1 10*3/uL (ref 0.0–0.1)
Basophils Relative: 0 %
Eosinophils Absolute: 0.1 10*3/uL (ref 0.0–0.5)
Eosinophils Relative: 1 %
Immature Granulocytes: 0 %
Lymphocytes Relative: 20 %
Lymphs Abs: 3.5 10*3/uL (ref 0.7–4.0)
Monocytes Absolute: 1.4 10*3/uL — ABNORMAL HIGH (ref 0.1–1.0)
Monocytes Relative: 8 %
Neutro Abs: 12.7 10*3/uL — ABNORMAL HIGH (ref 1.7–7.7)
Neutrophils Relative %: 71 %

## 2019-05-21 LAB — BASIC METABOLIC PANEL
Anion gap: 11 (ref 5–15)
BUN: 11 mg/dL (ref 6–20)
CO2: 23 mmol/L (ref 22–32)
Calcium: 8.9 mg/dL (ref 8.9–10.3)
Chloride: 101 mmol/L (ref 98–111)
Creatinine, Ser: 1.04 mg/dL (ref 0.61–1.24)
GFR calc Af Amer: >60 mL/min (ref >60)
GFR calc non Af Amer: >60 mL/min (ref >60)
Glucose, Bld: 105 mg/dL — ABNORMAL HIGH (ref 70–99)
Potassium: 3.3 mmol/L — ABNORMAL LOW (ref 3.5–5.1)
Sodium: 135 mmol/L (ref 135–145)

## 2019-05-21 LAB — CBC
HCT: 42.2 % (ref 39.0–52.0)
Hemoglobin: 13.4 g/dL (ref 13.0–17.0)
MCH: 26.5 pg (ref 26.0–34.0)
MCHC: 31.8 g/dL (ref 30.0–36.0)
MCV: 83.4 fL (ref 80.0–100.0)
Platelets: 384 10*3/uL (ref 150–400)
RBC: 5.06 MIL/uL (ref 4.22–5.81)
RDW: 12.4 % (ref 11.5–15.5)
WBC: 19.2 10*3/uL — ABNORMAL HIGH (ref 4.0–10.5)
nRBC: 0 % (ref 0.0–0.2)

## 2019-05-21 LAB — TROPONIN I (HIGH SENSITIVITY)
Troponin I (High Sensitivity): 5 ng/L (ref <18)
Troponin I (High Sensitivity): 4 ng/L (ref <18)

## 2019-05-21 MED ORDER — ALBUTEROL SULFATE HFA 108 (90 BASE) MCG/ACT IN AERS
4.0000 | INHALATION_SPRAY | Freq: Once | RESPIRATORY_TRACT | Status: AC
  Administered 2019-05-21: 03:00:00 4 via RESPIRATORY_TRACT
  Filled 2019-05-21: qty 6.7

## 2019-05-21 MED ORDER — LIDOCAINE VISCOUS HCL 2 % MT SOLN
15.0000 mL | Freq: Once | OROMUCOSAL | Status: AC
  Administered 2019-05-21: 03:00:00 15 mL via ORAL
  Filled 2019-05-21: qty 15

## 2019-05-21 MED ORDER — OMEPRAZOLE 20 MG PO CPDR: 20.0000 mg | DELAYED_RELEASE_CAPSULE | Freq: Every day | ORAL | 0 refills | Status: AC

## 2019-05-21 MED ORDER — METOCLOPRAMIDE HCL 5 MG/ML IJ SOLN: 10.0000 mg | Freq: Once | INTRAMUSCULAR | Status: DC

## 2019-05-21 MED ORDER — ALUM & MAG HYDROXIDE-SIMETH 200-200-20 MG/5ML PO SUSP
30.0000 mL | Freq: Once | ORAL | Status: AC
  Administered 2019-05-21: 03:00:00 30 mL via ORAL
  Filled 2019-05-21: qty 30

## 2019-05-21 MED ORDER — ALUMINUM-MAGNESIUM-SIMETHICONE 200-200-20 MG/5ML PO SUSP: 30.0000 mL | Freq: Three times a day (TID) | ORAL | 0 refills | Status: AC

## 2019-05-21 NOTE — ED Triage Notes (Signed)
[facility]. 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 Patient complaining of chest pain at this time. Patient also complaining of his lungs burning. Patient alert and oriented. Patient does have officer with him at bedside.

## 2019-05-21 NOTE — Discharge Instructions (Signed)
The ER work-up is normal. Please take the medications for symptom control.  We suspect that your symptoms were because of GERD.

## 2019-05-21 NOTE — ED Notes (Signed)
Patient given inhaler to take home as prescribed earlier. Patient in [place] custody. Discharge instructions reviewed with patient and [facility]O along with prescriptions.

## 2019-05-21 NOTE — ED Provider Notes (Signed)
[facility] EMERGENCY DEPARTMENT Provider Note   CSN: [id number] Arrival date & time: 05/21/19  0009     History Chief Complaint  Patient presents with  . Chest Pain    [name] is a 29 y.o. male.  HPI    29 year old male with history of kidney stones comes in a chief complaint of burning sensation in his chest.  Patient is currently incarcerated and brought here by police.  Patient reports that he smoked meth 2 days ago.  He was doing fine until around 9 PM when he started having burning sensation all over his chest.  He has worsening of the burning sensation when he takes deep breaths.  He was given nitro by prison and sent to the ER.  Patient denies any known underlying lung disease, GERD.  He has no crushing or severe chest pain.  Patient denies any abdominal pain, nausea, diaphoresis.  No history of similar pain.  Patient also denies any alcohol abuse, liver problems and states that he typically does not use crystal meth.  Past Medical History:  Diagnosis Date  . Renal disorder    kidney stones    There are no problems to display for this patient.   History reviewed. No pertinent surgical history.     History reviewed. No pertinent family history.  Social History   Tobacco Use  . Smoking status: Current Every Day Smoker    Packs/day: 0.50  . Smokeless tobacco: Never Used  Substance Use Topics  . Alcohol use: Yes    Comment: occasionally  . Drug use: Yes    Types: Marijuana, Methamphetamines    Home Medications Prior to Admission medications   Medication Sig Start Date End Date Taking? Authorizing Provider  acetaminophen (TYLENOL) 500 MG tablet Take 1,000 mg by mouth every 6 (six) hours as needed.    [provider]  aluminum-magnesium hydroxide-simethicone (MAALOX) 200-200-20 MG/5ML SUSP Take 30 mLs by mouth 4 (four) times daily -  before meals and at bedtime. 05/21/19   Varney Biles, MD  doxycycline (VIBRAMYCIN) 100 MG capsule Take 1  capsule (100 mg total) by mouth 2 (two) times daily. For 10 days 09/13/16   Triplett, Tammy, PA-C  ibuprofen (ADVIL,MOTRIN) 600 MG tablet Take 1 tablet (600 mg total) by mouth every 6 (six) hours as needed. Take with food 09/13/16   Triplett, Tammy, PA-C  omeprazole (PRILOSEC) 20 MG capsule Take 1 capsule (20 mg total) by mouth daily. 05/21/19   Varney Biles, MD    Allergies    Patient has no known allergies.  Review of Systems   Review of Systems  Constitutional: Positive for activity change.  Respiratory: Negative for cough and wheezing.   Cardiovascular: Positive for chest pain.  Gastrointestinal: Negative for nausea and vomiting.  Hematological: Does not bruise/bleed easily.  All other systems reviewed and are negative.   Physical Exam Updated Vital Signs BP 116/63 (BP Location: Left Arm)   Pulse 93   Temp 98.4 F (36.9 C) (Oral)   Resp 18   Ht 5\' 6"  (1.676 m)   Wt 70.3 kg   SpO2 99%   BMI 25.02 kg/m   Physical Exam Vitals and nursing note reviewed.  Constitutional:      Appearance: He is well-developed.  HENT:     Head: Atraumatic.  Neck:     Vascular: No JVD.  Cardiovascular:     Rate and Rhythm: Normal rate.     Pulses:  Radial pulses are 2+ on the right side and 2+ on the left side.     Heart sounds: Normal heart sounds.  Pulmonary:     Effort: Pulmonary effort is normal.     Breath sounds: No decreased breath sounds, wheezing, rhonchi or rales.  Musculoskeletal:     Cervical back: Neck supple.  Skin:    General: Skin is warm.  Neurological:     Mental Status: He is alert and oriented to person, place, and time.     ED Results / Procedures / Treatments   Labs (all labs ordered are listed, but only abnormal results are displayed) Labs Reviewed  CBC - Abnormal; Notable for the following components:      Result Value   WBC 19.2 (*)    All other components within normal limits  BASIC METABOLIC PANEL - Abnormal; Notable for the following  components:   Potassium 3.3 (*)    Glucose, Bld 105 (*)    All other components within normal limits  DIFFERENTIAL - Abnormal; Notable for the following components:   Neutro Abs 12.7 (*)    Monocytes Absolute 1.4 (*)    All other components within normal limits  TROPONIN I (HIGH SENSITIVITY)  TROPONIN I (HIGH SENSITIVITY)    EKG EKG Interpretation  Date/Time:  Saturday May 21 2019 00:40:40 EST Ventricular Rate:  86 PR Interval:    QRS Duration: 78 QT Interval:  352 QTC Calculation: 421 R Axis:   64 Text Interpretation: Sinus rhythm Borderline T wave abnormalities No acute changes No significant change since last tracing Nonspecific T wave abnormality Confirmed by Derwood Kaplan ([id number]) on 05/21/2019 1:15:22 AM   Radiology DG Chest Port 1 View  Result Date: 05/21/2019 CLINICAL DATA:  Burning chest pain. EXAM: PORTABLE CHEST 1 VIEW COMPARISON:  05/15/2015 FINDINGS: The heart size and mediastinal contours are within normal limits. Both lungs are clear. The visualized skeletal structures are unremarkable. IMPRESSION: No active disease. Electronically Signed   By: Katherine Mantle M.D.   On: 05/21/2019 02:03    Procedures Procedures (including critical care time)  Medications Ordered in ED Medications  alum & mag hydroxide-simeth (MAALOX/MYLANTA) 200-200-20 MG/5ML suspension 30 mL (30 mLs Oral Given 05/21/19 0250)    And  lidocaine (XYLOCAINE) 2 % viscous mouth solution 15 mL (15 mLs Oral Given 05/21/19 0250)  albuterol (VENTOLIN HFA) 108 (90 Base) MCG/ACT inhaler 4 puff (4 puffs Inhalation Given 05/21/19 0250)    ED Course  I have reviewed the triage vital signs and the nursing notes.  Pertinent labs & imaging results that were available during my care of the patient were reviewed by me and considered in my medical decision making (see chart for details).    MDM Rules/Calculators/A&P                       29 year old comes in a chief complaint of burning pain to his  chest.  Sudden onset symptoms that started around 9 PM.  He has no other concerning constitutional's and denies any crushing or severe chest pain.  He has no underlying lung disease and is not noted to have any wheezing.  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 No vaping.  Exam is benign.  Hemodynamically stable.  Chest x-ray ordered and it is negative for any acute findings. Patient's white count is elevated at 19. EKG is reassuring and initial high-sensitivity troponin is normal.  White  count is likely reactive in nature.  Clinically this does not sound pneumonia as the burning sensation is present diffusely over the chest.  Without him having any cough or wheezing I am not sure if I would treat this like bronchitis.  It is possible that there could be some alveoli injury.  No hemoptysis is reassuring.  No hypoxia noted.  Positive discharge patient if his tropes are negative.  Strict ER return precautions will be discussed.  4:08 AM The patient appears reasonably screened and/or stabilized for discharge and I doubt any other medical condition or other [facility] requiring further screening, evaluation, or treatment in the ED at this time prior to discharge.   Results from the ER workup discussed with the patient face to face and all questions answered to the best of my ability. The patient is safe for discharge with strict return precautions.  Pt reports that the symptoms improved post maalox.   Final Clinical Impression(s) / ED Diagnoses Final diagnoses:  Gastroesophageal reflux disease, unspecified whether esophagitis present    Rx / DC Orders ED Discharge Orders         Ordered    omeprazole (PRILOSEC) 20 MG capsule  Daily     05/21/19 0408    aluminum-magnesium hydroxide-simethicone (MAALOX) 200-200-20 MG/5ML SUSP  3 times daily before meals & bedtime     05/21/19 0408           Derwood Kaplan, MD 05/21/19 [phone]
